# Patient Record
Sex: Female | Born: 1990
Health system: Southern US, Community
[De-identification: ages and names within clinical notes are randomized; demographics above are authoritative.]

## PROBLEM LIST (undated history)

## (undated) ENCOUNTER — Inpatient Hospital Stay (HOSPITAL_COMMUNITY): Payer: Self-pay

## (undated) DIAGNOSIS — Z789 Other specified health status: Secondary | ICD-10-CM

## (undated) DIAGNOSIS — IMO0002 Reserved for concepts with insufficient information to code with codable children: Secondary | ICD-10-CM

## (undated) DIAGNOSIS — N390 Urinary tract infection, site not specified: Secondary | ICD-10-CM

## (undated) HISTORY — PX: NO PAST SURGERIES: SHX2092

## (undated) HISTORY — DX: Urinary tract infection, site not specified: N39.0

---
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
## 2012-03-27 ENCOUNTER — Encounter: Payer: Self-pay | Admitting: Pediatrics

## 2012-03-27 ENCOUNTER — Other Ambulatory Visit: Payer: Self-pay | Admitting: Pediatrics

## 2012-03-27 ENCOUNTER — Ambulatory Visit (INDEPENDENT_AMBULATORY_CARE_PROVIDER_SITE_OTHER): Payer: BC Managed Care – PPO | Admitting: Pediatrics

## 2012-03-27 VITALS — BP 106/68 | Wt 176.1 lb

## 2012-03-27 DIAGNOSIS — N3090 Cystitis, unspecified without hematuria: Secondary | ICD-10-CM | POA: Insufficient documentation

## 2012-03-27 DIAGNOSIS — N390 Urinary tract infection, site not specified: Secondary | ICD-10-CM

## 2012-03-27 MED ORDER — SULFAMETHOXAZOLE-TRIMETHOPRIM 800-160 MG PO TABS: 1.0000 | ORAL_TABLET | Freq: Two times a day (BID) | ORAL | Status: AC

## 2012-03-27 NOTE — Progress Notes (Signed)
Subjective:    Patient ID: [name], female   DOB: 1991-03-04, 21 y.o.   MRN: [id number]  HPI: 4-5 day hx of dysur.ia. No fever. No nausea or vomiting. Mild Lower abd pain transiently last night, otherwise no pain. No abd pain today. Dysuria, frequency, urgency. No vaginal discharge.  Pertinent PMHx: Hx of recurrent lower UTI. Last UTI in Feb. Sexually active. Uses condoms and oral contraceptives.Has never had an STI.  Ran out of birth control pills while at the beach and missed several. Off for the past few weeks. Prescribed by a physician at [facility] Fragmented medical care the last few years. Student at [employer] but has never been to [employer]. Has seen Dr. Denny Levy at [facility]. Seeks care episodically for acute concerns.  Imm UTD NKDA Meds: Off Birth Control pills but has refill on Rx and intends to restart after next period. Other meds: OTC pyridium for UTI. Has not had a PE in over 2 years.    Objective:  Weight 176 lb 1.6 oz (79.878 kg). GEN: Alert, nontoxic, in NAD. Pleasant young lady.  Normal affect. COR: Quiet precordium, No murmur, RRR ABD: soft, nontender, nondistended, no organomegly, no masses SKIN: well perfused, no rashes NEURO: alert, active,oriented, grossly intact  No results found. No results found for this or any previous visit (from the past 240 hour(s)). @RESULTS @ Assessment:   Cystitis, recurrent Fragmented health care Delinquent health maintenance  Plan:  Urine culture sent U/A -- patient will bring Urine in the morning for U/A off pyridium Discussed importance of medical home. Suggested [employer] [facility] or Dr. Denny Levy at [facility] since she has seen Dr. Jennette Kettle at [facility]. Andrian will investigate these two options for the future After obtaining U/A will empirically start her on Septra DS for 5 days while awaiting culture results.

## 2012-03-27 NOTE — Patient Instructions (Signed)
Plenty of fluids Urinate frequently TMP - SMX per Rx Need to find primary care provider -- suggest [facility] or Dr. Denny Levy at [facility]

## 2012-03-28 LAB — URINALYSIS
Specific Gravity, Urine: 1.01 (ref 1.005–1.030)
pH: 7 (ref 5.0–8.0)

## 2012-03-30 LAB — URINE CULTURE: Colony Count: >=100000

## 2012-03-31 LAB — URINE CULTURE: Colony Count: >=100000

## 2012-05-02 ENCOUNTER — Other Ambulatory Visit: Payer: Self-pay | Admitting: Pediatrics

## 2012-05-02 ENCOUNTER — Encounter: Payer: Self-pay | Admitting: Pediatrics

## 2012-05-19 ENCOUNTER — Emergency Department (HOSPITAL_COMMUNITY)
Admission: EM | Admit: 2012-05-19 | Discharge: 2012-05-19 | Disposition: A | Discharge status: Home / Self Care | Payer: No Typology Code available for payment source | Attending: Emergency Medicine | Admitting: Emergency Medicine

## 2012-05-19 ENCOUNTER — Emergency Department (HOSPITAL_COMMUNITY): Payer: No Typology Code available for payment source

## 2012-05-19 ENCOUNTER — Encounter (HOSPITAL_COMMUNITY): Payer: Self-pay

## 2012-05-19 DIAGNOSIS — M545 Low back pain, unspecified: Secondary | ICD-10-CM | POA: Insufficient documentation

## 2012-05-19 DIAGNOSIS — M542 Cervicalgia: Secondary | ICD-10-CM | POA: Insufficient documentation

## 2012-05-19 HISTORY — DX: Reserved for concepts with insufficient information to code with codable children: IMO0002

## 2012-05-19 MED ORDER — CYCLOBENZAPRINE HCL 10 MG PO TABS: 10.0000 mg | ORAL_TABLET | Freq: Two times a day (BID) | ORAL | Status: AC | PRN

## 2012-05-19 MED ORDER — HYDROCODONE-ACETAMINOPHEN 5-500 MG PO TABS: 1.0000 | ORAL_TABLET | Freq: Four times a day (QID) | ORAL | Status: AC | PRN

## 2012-05-19 MED ORDER — MORPHINE SULFATE 4 MG/ML IJ SOLN
4.0000 mg | Freq: Once | INTRAMUSCULAR | Status: AC
  Administered 2012-05-19: 4 mg via INTRAVENOUS
  Filled 2012-05-19: qty 1

## 2012-05-19 MED ORDER — ONDANSETRON HCL 4 MG/2ML IJ SOLN
4.0000 mg | Freq: Once | INTRAMUSCULAR | Status: AC
  Administered 2012-05-19: 4 mg via INTRAVENOUS
  Filled 2012-05-19: qty 2

## 2012-05-19 NOTE — ED Notes (Signed)
Family at bedside. 
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
## 2012-05-20 NOTE — ED Provider Notes (Signed)
Medical screening examination/treatment/procedure(s) were performed by non-physician practitioner and as supervising physician I was immediately available for consultation/collaboration.   Dione Booze, MD 05/20/12 0140

## 2012-06-10 ENCOUNTER — Ambulatory Visit: Payer: No Typology Code available for payment source | Attending: Orthopedic Surgery | Admitting: Rehabilitation

## 2012-06-10 DIAGNOSIS — M25669 Stiffness of unspecified knee, not elsewhere classified: Secondary | ICD-10-CM | POA: Insufficient documentation

## 2012-06-10 DIAGNOSIS — M25569 Pain in unspecified knee: Secondary | ICD-10-CM | POA: Insufficient documentation

## 2012-06-10 DIAGNOSIS — IMO0001 Reserved for inherently not codable concepts without codable children: Secondary | ICD-10-CM | POA: Insufficient documentation

## 2012-06-16 ENCOUNTER — Ambulatory Visit: Payer: No Typology Code available for payment source | Admitting: Physical Therapy

## 2012-06-20 ENCOUNTER — Ambulatory Visit: Payer: No Typology Code available for payment source | Admitting: Physical Therapy

## 2012-06-23 ENCOUNTER — Ambulatory Visit: Payer: No Typology Code available for payment source | Admitting: Physical Therapy

## 2012-06-25 ENCOUNTER — Ambulatory Visit: Payer: No Typology Code available for payment source | Admitting: Physical Therapy

## 2012-06-27 ENCOUNTER — Ambulatory Visit: Payer: No Typology Code available for payment source | Admitting: Physical Therapy

## 2012-07-01 ENCOUNTER — Encounter: Payer: No Typology Code available for payment source | Admitting: Physical Therapy

## 2012-07-03 ENCOUNTER — Ambulatory Visit: Payer: No Typology Code available for payment source | Admitting: Physical Therapy

## 2012-07-08 ENCOUNTER — Ambulatory Visit: Payer: No Typology Code available for payment source | Admitting: Physical Therapy

## 2012-07-11 ENCOUNTER — Ambulatory Visit: Payer: No Typology Code available for payment source | Admitting: Physical Therapy

## 2012-07-15 ENCOUNTER — Ambulatory Visit: Payer: No Typology Code available for payment source | Admitting: Rehabilitation

## 2012-07-17 ENCOUNTER — Ambulatory Visit: Payer: No Typology Code available for payment source | Admitting: Physical Therapy

## 2012-07-18 ENCOUNTER — Ambulatory Visit: Payer: No Typology Code available for payment source | Attending: Orthopedic Surgery | Admitting: Physical Therapy

## 2012-07-18 DIAGNOSIS — M25669 Stiffness of unspecified knee, not elsewhere classified: Secondary | ICD-10-CM | POA: Insufficient documentation

## 2012-07-18 DIAGNOSIS — M25569 Pain in unspecified knee: Secondary | ICD-10-CM | POA: Insufficient documentation

## 2012-07-18 DIAGNOSIS — IMO0001 Reserved for inherently not codable concepts without codable children: Secondary | ICD-10-CM | POA: Insufficient documentation

## 2012-07-25 ENCOUNTER — Ambulatory Visit: Payer: No Typology Code available for payment source | Admitting: Physical Therapy

## 2012-07-31 ENCOUNTER — Ambulatory Visit: Payer: No Typology Code available for payment source | Admitting: Physical Therapy

## 2012-08-05 ENCOUNTER — Ambulatory Visit: Payer: No Typology Code available for payment source | Admitting: Rehabilitation

## 2012-08-08 ENCOUNTER — Ambulatory Visit: Payer: No Typology Code available for payment source | Admitting: Physical Therapy

## 2013-09-03 ENCOUNTER — Telehealth: Payer: Self-pay

## 2013-09-03 ENCOUNTER — Ambulatory Visit (INDEPENDENT_AMBULATORY_CARE_PROVIDER_SITE_OTHER): Payer: BC Managed Care – PPO | Admitting: Family Medicine

## 2013-09-03 VITALS — BP 120/72 | HR 78 | Temp 97.9°F | Resp 18 | Ht 68.0 in | Wt 166.0 lb

## 2013-09-03 DIAGNOSIS — R112 Nausea with vomiting, unspecified: Secondary | ICD-10-CM

## 2013-09-03 DIAGNOSIS — E860 Dehydration: Secondary | ICD-10-CM

## 2013-09-03 DIAGNOSIS — R109 Unspecified abdominal pain: Secondary | ICD-10-CM

## 2013-09-03 DIAGNOSIS — K2920 Alcoholic gastritis without bleeding: Secondary | ICD-10-CM

## 2013-09-03 LAB — POCT CBC
Hemoglobin: 14.5 g/dL (ref 12.2–16.2)
MCH, POC: 31.2 pg (ref 27–31.2)
MID (cbc): 0.4 (ref 0–0.9)
MPV: 8.8 fL (ref 0–99.8)
Platelet Count, POC: 353 10*3/uL (ref 142–424)
RBC: 4.65 M/uL (ref 4.04–5.48)

## 2013-09-03 LAB — POCT UA - MICROSCOPIC ONLY
Crystals, Ur, HPF, POC: NEGATIVE
Mucus, UA: POSITIVE
Yeast, UA: NEGATIVE

## 2013-09-03 LAB — POCT URINALYSIS DIPSTICK
Glucose, UA: NEGATIVE
Leukocytes, UA: NEGATIVE
Protein, UA: 100

## 2013-09-03 LAB — COMPREHENSIVE METABOLIC PANEL
ALT: 15 U/L (ref 0–35)
AST: 25 U/L (ref 0–37)
Chloride: 101 mEq/L (ref 96–112)
Potassium: 4.4 mEq/L (ref 3.5–5.3)
Sodium: 138 mEq/L (ref 135–145)

## 2013-09-03 MED ORDER — ONDANSETRON 8 MG PO TBDP: 8.0000 mg | ORAL_TABLET | Freq: Three times a day (TID) | ORAL | Status: DC | PRN

## 2013-09-03 NOTE — Telephone Encounter (Signed)
[facility] pharmacist called looking for Rx for pt. We had sent it to [facility] [address] instead of [facility]. I called in Rx info to pharmacist while on phone and asked him to check if quantity would go through ins since we often have PA required. Pharmacist stated PA required for #30, but will pay for #21. I decreased Quantity to #21 and called [facility] to cancel.

## 2013-09-03 NOTE — Patient Instructions (Signed)
Alcoholic Gastritis You have alcoholic gastritis. This is an inflammation of the lining of the stomach. It is caused by drinking alcohol. The symptoms may include: burning abdominal pain, nausea, vomiting or even vomiting blood. It can be made worse by a poor diet. People who drink frequently often do not eat well. Taking aspirin or other anti-inflammatory medications increases stomach irritation and bleeding. These medicines should be avoided. Treatment is aimed at the cause. You have to stop drinking alcohol if you want to get better. Eat a healthy, well-balanced diet. You may take liquid antacids as needed. Your caregiver may prescribe medications to help heal the stomach lining. Take these as prescribed. PREVENTION   Anyone who has experienced alcoholic gastritis should consider that alcoholism may be an issue. Professional evaluation is highly recommended.  Although some people can recover without help, most need assistance. With treatment and support, many are able to stop drinking and rebuild their lives. Long-term recovery is possible.  Alcohol Addiction cannot be cured, but it can be treated successfully. Treatment centers are listed in telephone listings under:  Alcoholism and Addiction Treatment; Substance Abuse Treatment or Cocaine, Narcotics and Alcoholics Anonymous. Most hospitals and clinics can refer you to a specialized care center.  The [place] government maintains a toll-free number for treatment referrals: 1-800-662-4357 or 1-800-487-4889 (TDD). They also maintain a website: http://findtreatment.samhsa.gov. Other websites for more information are: www.mentalhealth.samhsa.gov and www.nida.gov.  In [place], treatment resources are listed in each province. Listings are available under:  [employer] or similar titles. SEEK IMMEDIATE MEDICAL CARE IF:   You develop severe abdominal pain, uncontrolled vomiting, or vomiting blood.  You blackout or have fainting  spells.  You develop seizures (this could be life threatening).  You develop bloody stools or stools that appear black or tarry. Document Released: 01/03/2005 Document Revised: 02/18/2012 Document Reviewed: 11/30/2009 ExitCare Patient Information 2014 [employer]. Gastritis, Adult Gastritis is soreness and swelling (inflammation) of the lining of the stomach. Gastritis can develop as a sudden onset (acute) or long-term (chronic) condition. If gastritis is not treated, it can lead to stomach bleeding and ulcers. CAUSES  Gastritis occurs when the stomach lining is weak or damaged. Digestive juices from the stomach then inflame the weakened stomach lining. The stomach lining may be weak or damaged due to viral or bacterial infections. One common bacterial infection is the Helicobacter pylori infection. Gastritis can also result from excessive alcohol consumption, taking certain medicines, or having too much acid in the stomach.  SYMPTOMS  In some cases, there are no symptoms. When symptoms are present, they may include:  Pain or a burning sensation in the upper abdomen.  Nausea.  Vomiting.  An uncomfortable feeling of fullness after eating. DIAGNOSIS  Your caregiver may suspect you have gastritis based on your symptoms and a physical exam. To determine the cause of your gastritis, your caregiver may perform the following:  Blood or stool tests to check for the H pylori bacterium.  Gastroscopy. A thin, flexible tube (endoscope) is passed down the esophagus and into the stomach. The endoscope has a light and camera on the end. Your caregiver uses the endoscope to view the inside of the stomach.  Taking a tissue sample (biopsy) from the stomach to examine under a microscope. TREATMENT  Depending on the cause of your gastritis, medicines may be prescribed. If you have a bacterial infection, such as an H pylori infection, antibiotics may be given. If your gastritis is caused by too much    acid in the stomach, H2 blockers or antacids may be given. Your caregiver may recommend that you stop taking aspirin, ibuprofen, or other nonsteroidal anti-inflammatory drugs (NSAIDs). HOME CARE INSTRUCTIONS  Only take over-the-counter or prescription medicines as directed by your caregiver.  If you were given antibiotic medicines, take them as directed. Finish them even if you start to feel better.  Drink enough fluids to keep your urine clear or pale yellow.  Avoid foods and drinks that make your symptoms worse, such as:  Caffeine or alcoholic drinks.  Chocolate.  Peppermint or mint flavorings.  Garlic and onions.  Spicy foods.  Citrus fruits, such as oranges, lemons, or limes.  Tomato-based foods such as sauce, chili, salsa, and pizza.  Fried and fatty foods.  Eat small, frequent meals instead of large meals. SEEK IMMEDIATE MEDICAL CARE IF:   You have black or dark red stools.  You vomit blood or material that looks like coffee grounds.  You are unable to keep fluids down.  Your abdominal pain gets worse.  You have a fever.  You do not feel better after 1 week.  You have any other questions or concerns. MAKE SURE YOU:  Understand these instructions.  Will watch your condition.  Will get help right away if you are not doing well or get worse. Document Released: 11/20/2001 Document Revised: 05/27/2012 Document Reviewed: 01/09/2012 ExitCare Patient Information 2014 [employer].  

## 2013-09-03 NOTE — Progress Notes (Signed)
Subjective:    Patient ID: [name], female    DOB: 04-21-1991, 22 y.o.   MRN: [id number] Chief Complaint  Patient presents with  . vomiting, headache, stomach pain    stated this morning    HPI  Went out last night and was drinking alcohol.  This time is much worse than anytime previous. Has been ill since 2 a.m. And then finally slept till 7:30 - then tried to go into class but kept on vomiting - had to pull over car and leave class several times.  With the last several vomits it looked like there was blood in there. Had 4-5 shots/mixed drinks - no beer or wine.  Tried to drink some water but just throws it right back up.  Blood in vomiting looked more ilke coffee grounds.  Has been urinating normaly. Had a nml BM this a.m.  Past Medical History  Diagnosis Date  . UTI (lower urinary tract infection)     recurrent  . Herniated disc    History reviewed. No pertinent past surgical history. History reviewed. No pertinent family history.   Review of Systems  Constitutional: Positive for diaphoresis, activity change, appetite change and fatigue. Negative for fever, chills and unexpected weight change.  Respiratory: Negative for shortness of breath.   Cardiovascular: Negative for chest pain and leg swelling.  Gastrointestinal: Positive for nausea, vomiting, abdominal pain and abdominal distention. Negative for diarrhea, constipation, blood in stool, anal bleeding and rectal pain.  Genitourinary: Negative for dysuria, decreased urine volume and difficulty urinating.  Musculoskeletal: Negative for gait problem.  Skin: Negative for rash.  Neurological: Positive for weakness, light-headedness and headaches. Negative for dizziness, tremors, seizures, syncope, facial asymmetry, speech difficulty and numbness.  Hematological: Negative for adenopathy.  Psychiatric/Behavioral: Positive for sleep disturbance.       BP 120/72  Pulse 78  Temp(Src) 97.9 F (36.6 C) (Oral)  Resp 18  Ht  5\' 8"  (1.727 m)  Wt 166 lb (75.297 kg)  BMI 25.25 kg/m2  SpO2 97%  LMP 08/24/2013 Objective:   Physical Exam  Constitutional: She is oriented to person, place, and time. She appears well-developed and well-nourished. No distress.  HENT:  Head: Normocephalic and atraumatic.  Neck: Normal range of motion. Neck supple. No thyromegaly present.  Cardiovascular: Normal rate, regular rhythm, normal heart sounds and intact distal pulses.   Pulmonary/Chest: Effort normal and breath sounds normal. No respiratory distress.  Abdominal: Soft. Normal appearance and bowel sounds are normal. She exhibits no distension and no mass. There is no hepatosplenomegaly. There is generalized tenderness. There is CVA tenderness. There is no rigidity, no rebound, no guarding, no tenderness at McBurney's point and negative Murphy's sign. No hernia.  Musculoskeletal: She exhibits no edema.  Lymphadenopathy:    She has no cervical adenopathy.  Neurological: She is alert and oriented to person, place, and time.  Skin: Skin is warm and dry. She is not diaphoretic. No erythema.  Psychiatric: She has a normal mood and affect. Her behavior is normal.          Assessment & Plan:   Nausea with vomiting - Plan: POCT CBC, POCT UA - Microscopic Only, POCT urinalysis dipstick, Lipase, Comprehensive metabolic panel, CANCELED: Comprehensive metabolic panel, CANCELED: Lipase  Abdominal  pain, other specified site - Plan: POCT CBC, POCT UA - Microscopic Only, POCT urinalysis dipstick, Lipase, Comprehensive metabolic panel, CANCELED: Comprehensive metabolic panel, CANCELED: Lipase  Alcoholic gastritis  Meds ordered this encounter  Medications  .  ondansetron (ZOFRAN-ODT)  8 MG disintegrating tablet    Sig: Take 1 tablet (8 mg total) by mouth every 8 (eight) hours as needed for nausea.    Dispense:  30 tablet    Refill:  0    Norberto Sorenson, MD MPH

## 2014-07-17 ENCOUNTER — Encounter (HOSPITAL_COMMUNITY): Payer: Self-pay | Admitting: Emergency Medicine

## 2014-07-17 ENCOUNTER — Emergency Department (HOSPITAL_COMMUNITY)
Admission: EM | Admit: 2014-07-17 | Discharge: 2014-07-17 | Disposition: A | Discharge status: Home / Self Care | Payer: BC Managed Care – PPO | Attending: Emergency Medicine | Admitting: Emergency Medicine

## 2014-07-17 DIAGNOSIS — K226 Gastro-esophageal laceration-hemorrhage syndrome: Secondary | ICD-10-CM | POA: Insufficient documentation

## 2014-07-17 DIAGNOSIS — Z87440 Personal history of urinary (tract) infections: Secondary | ICD-10-CM | POA: Insufficient documentation

## 2014-07-17 DIAGNOSIS — K920 Hematemesis: Secondary | ICD-10-CM | POA: Insufficient documentation

## 2014-07-17 DIAGNOSIS — Z8739 Personal history of other diseases of the musculoskeletal system and connective tissue: Secondary | ICD-10-CM | POA: Insufficient documentation

## 2014-07-17 LAB — COMPREHENSIVE METABOLIC PANEL
ALT: 15 U/L (ref 0–35)
AST: 25 U/L (ref 0–37)
Albumin: 4.7 g/dL (ref 3.5–5.2)
Alkaline Phosphatase: 61 U/L (ref 39–117)
Anion gap: 18 — ABNORMAL HIGH (ref 5–15)
BILIRUBIN TOTAL: 0.9 mg/dL (ref 0.3–1.2)
BUN: 8 mg/dL (ref 6–23)
CHLORIDE: 100 meq/L (ref 96–112)
CO2: 26 mEq/L (ref 19–32)
Calcium: 9.4 mg/dL (ref 8.4–10.5)
Creatinine, Ser: 0.83 mg/dL (ref 0.50–1.10)
GFR calc Af Amer: >90 mL/min (ref >90)
GFR calc non Af Amer: >90 mL/min (ref >90)
GLUCOSE: 116 mg/dL — AB (ref 70–99)
POTASSIUM: 3.9 meq/L (ref 3.7–5.3)
SODIUM: 144 meq/L (ref 137–147)
TOTAL PROTEIN: 8.4 g/dL — AB (ref 6.0–8.3)

## 2014-07-17 LAB — CBC WITH DIFFERENTIAL/PLATELET
Basophils Absolute: 0 10*3/uL (ref 0.0–0.1)
Basophils Relative: 0 % (ref 0–1)
Eosinophils Absolute: 0 10*3/uL (ref 0.0–0.7)
Eosinophils Relative: 0 % (ref 0–5)
HCT: 47.4 % — ABNORMAL HIGH (ref 36.0–46.0)
Hemoglobin: 15.8 g/dL — ABNORMAL HIGH (ref 12.0–15.0)
LYMPHS ABS: 1.3 10*3/uL (ref 0.7–4.0)
LYMPHS PCT: 11 % — AB (ref 12–46)
MCH: 31.5 pg (ref 26.0–34.0)
MCHC: 33.3 g/dL (ref 30.0–36.0)
MCV: 94.4 fL (ref 78.0–100.0)
MONO ABS: 0.4 10*3/uL (ref 0.1–1.0)
Monocytes Relative: 4 % (ref 3–12)
Neutro Abs: 9.8 10*3/uL — ABNORMAL HIGH (ref 1.7–7.7)
Neutrophils Relative %: 85 % — ABNORMAL HIGH (ref 43–77)
PLATELETS: 308 10*3/uL (ref 150–400)
RBC: 5.02 MIL/uL (ref 3.87–5.11)
RDW: 13.6 % (ref 11.5–15.5)
WBC: 11.5 10*3/uL — ABNORMAL HIGH (ref 4.0–10.5)

## 2014-07-17 LAB — LIPASE, BLOOD: Lipase: 14 U/L (ref 11–59)

## 2014-07-17 MED ORDER — PANTOPRAZOLE SODIUM 40 MG PO TBEC: 40.0000 mg | DELAYED_RELEASE_TABLET | Freq: Two times a day (BID) | ORAL | Status: DC

## 2014-07-17 MED ORDER — ONDANSETRON HCL 4 MG/2ML IJ SOLN
4.0000 mg | Freq: Once | INTRAMUSCULAR | Status: AC
  Administered 2014-07-17: 4 mg via INTRAVENOUS
  Filled 2014-07-17: qty 2

## 2014-07-17 MED ORDER — SODIUM CHLORIDE 0.9 % IV BOLUS (SEPSIS)
1000.0000 mL | Freq: Once | INTRAVENOUS | Status: AC
  Administered 2014-07-17: 1000 mL via INTRAVENOUS

## 2014-07-17 MED ORDER — ONDANSETRON HCL 4 MG PO TABS: 4.0000 mg | ORAL_TABLET | Freq: Four times a day (QID) | ORAL | Status: DC

## 2014-07-17 NOTE — ED Provider Notes (Signed)
CSN: [id number]     Arrival date & time 07/17/14  1145 History   First MD Initiated Contact with Patient 07/17/14 1202     Chief Complaint  Patient presents with  . Hematemesis     (Consider location/radiation/quality/duration/timing/severity/associated sxs/prior Treatment) Patient is a 23 y.o. female presenting with vomiting.  Emesis Severity:  Moderate Duration:  1 day Timing:  Constant Number of daily episodes:  7 Quality:  Stomach contents and bright red blood Progression:  Unchanged Chronicity:  New Recent urination:  Normal Relieved by:  Nothing Worsened by:  Nothing tried Associated symptoms: abdominal pain   Associated symptoms: no chills, no cough, no diarrhea, no fever, no sore throat and no URI   Risk factors: alcohol use (last night)     Past Medical History  Diagnosis Date  . UTI (lower urinary tract infection)     recurrent  . Herniated disc    History reviewed. No pertinent past surgical history. No family history on file. History  Substance Use Topics  . Smoking status: Never Smoker   . Smokeless tobacco: Not on file  . Alcohol Use: Yes   OB History   Grav Para Term Preterm Abortions TAB SAB Ect Mult Living                 Review of Systems  Constitutional: Negative for fever and chills.  HENT: Negative for congestion, rhinorrhea and sore throat.   Eyes: Negative for photophobia and visual disturbance.  Respiratory: Negative for cough and shortness of breath.   Cardiovascular: Negative for chest pain and leg swelling.  Gastrointestinal: Positive for vomiting and abdominal pain. Negative for nausea, diarrhea and constipation.  Endocrine: Negative for polyphagia and polyuria.  Genitourinary: Negative for dysuria, flank pain, vaginal bleeding, vaginal discharge and enuresis.  Musculoskeletal: Negative for back pain and gait problem.  Skin: Negative for color change and rash.  Neurological: Negative for dizziness, syncope, light-headedness and  numbness.  Hematological: Negative for adenopathy. Does not bruise/bleed easily.  All other systems reviewed and are negative.     Allergies  Review of patient's allergies indicates no known allergies.  Home Medications   Prior to Admission medications   Medication Sig Start Date End Date Taking? Authorizing Provider  ondansetron (ZOFRAN) 4 MG tablet Take 1 tablet (4 mg total) by mouth every 6 (six) hours. 07/17/14   Mirian Mo, MD  pantoprazole (PROTONIX) 40 MG tablet Take 1 tablet (40 mg total) by mouth 2 (two) times daily. 07/17/14   Mirian Mo, MD   BP 132/81  Pulse 60  Temp(Src) 97.5 F (36.4 C) (Oral)  Resp 18  Ht 5\' 8"  (1.727 m)  Wt 152 lb 9 oz (69.202 kg)  BMI 23.20 kg/m2  SpO2 100%  LMP 07/05/2014 Physical Exam  Vitals reviewed. Constitutional: She is oriented to person, place, and time. She appears well-developed and well-nourished.  HENT:  Head: Normocephalic and atraumatic.  Right Ear: External ear normal.  Left Ear: External ear normal.  Eyes: Conjunctivae and EOM are normal. Pupils are equal, round, and reactive to light.  Neck: Normal range of motion. Neck supple.  Cardiovascular: Normal rate, regular rhythm, normal heart sounds and intact distal pulses.   Pulmonary/Chest: Effort normal and breath sounds normal.  Abdominal: Soft. Bowel sounds are normal. There is tenderness in the right upper quadrant and epigastric area.  Musculoskeletal: Normal range of motion.  Neurological: She is alert and oriented to person, place, and time.  Skin: Skin is warm and dry.  ED Course  Procedures (including critical care time) Labs Review Labs Reviewed  CBC WITH DIFFERENTIAL - Abnormal; Notable for the following:    WBC 11.5 (*)    Hemoglobin 15.8 (*)    HCT 47.4 (*)    Neutrophils Relative % 85 (*)    Neutro Abs 9.8 (*)    Lymphocytes Relative 11 (*)    All other components within normal limits  COMPREHENSIVE METABOLIC PANEL - Abnormal; Notable for the  following:    Glucose, Bld 116 (*)    Total Protein 8.4 (*)    Anion gap 18 (*)    All other components within normal limits  LIPASE, BLOOD  URINALYSIS, ROUTINE W REFLEX MICROSCOPIC  POC URINE PREG, ED    Imaging Review No results found.   EKG Interpretation None      MDM   Final diagnoses:  Mallory-Weiss tear    23 y.o. female  without pertinent PMH presents with chief complaint of hematemesis. Patient was in her normal state of health yesterday, however last night drink a significant amount of vodka. This morning she woke up and had vomiting, vomited 6-7 times with stomach contents, followed by 2 vomiting episodes in increasing amounts of blood which the patient describes as dark red blood with some spots. She has no history of peptic ulcer disease, or other GI issues. Physical exam as above with epigastric and right upper quadrant tenderness. Will check labs to ensure the patient is not currently having a pancreatic flare, however she has no history of pancreatitis or gallstones.    Labs returned as above with stable hemoglobin, and patient had a vomiting episode which was clear in the department.  She was given Zofran, a normal saline bolus and had improvement of her symptoms.  Labs as above were unremarkable, suspect that the patient had alcohol mediated vomiting this morning and Mallory-Weiss tear, however discussed return precautions for possible peptic ulcer disease and informed the patient to take a PPI.    Labs and imaging as above reviewed.   1. Mallory-Weiss tear         Mirian MoMatthew Ilene Witcher, MD 07/17/14 1416

## 2014-07-17 NOTE — ED Notes (Signed)
Patient unable to urinate for sample

## 2014-07-17 NOTE — ED Notes (Signed)
Pt c/o vomiting blood onset this morning. Pt reports drinking last night but does not know how much. Pt with dry heaves in triage.

## 2014-07-17 NOTE — Discharge Instructions (Signed)

## 2014-12-10 NOTE — L&D Delivery Note (Signed)
Delivery Note At 11:37 AM a viable female was delivered via  (Presentation: ;  ).  APGAR: , ; weight  .   Placenta status: , .  Cord:  with the following complications: .  Cord pH: not done  Anesthesia: Epidural  Episiotomy: None Lacerations: 1st degree;Sulcus;Periurethral Suture Repair: 2.0 vicryl Est. Blood Loss (mL):    Mom to postpartum.  Baby to Couplet care / Skin to Skin.  MARSHALL,BERNARD A 09/01/2015, 11:50 AM

## 2014-12-29 ENCOUNTER — Inpatient Hospital Stay (HOSPITAL_COMMUNITY)
Admission: AD | Admit: 2014-12-29 | Discharge: 2014-12-29 | Disposition: A | Discharge status: Home / Self Care | Payer: BC Managed Care – PPO | Source: Ambulatory Visit | Attending: Obstetrics & Gynecology | Admitting: Obstetrics & Gynecology

## 2014-12-29 ENCOUNTER — Encounter (HOSPITAL_COMMUNITY): Payer: Self-pay

## 2014-12-29 DIAGNOSIS — Z3200 Encounter for pregnancy test, result unknown: Secondary | ICD-10-CM | POA: Diagnosis present

## 2014-12-29 DIAGNOSIS — Z3201 Encounter for pregnancy test, result positive: Secondary | ICD-10-CM | POA: Insufficient documentation

## 2014-12-29 LAB — URINALYSIS, ROUTINE W REFLEX MICROSCOPIC
BILIRUBIN URINE: NEGATIVE
GLUCOSE, UA: NEGATIVE mg/dL
Hgb urine dipstick: NEGATIVE
KETONES UR: NEGATIVE mg/dL
NITRITE: NEGATIVE
PH: 7.5 (ref 5.0–8.0)
PROTEIN: NEGATIVE mg/dL
Specific Gravity, Urine: 1.01 (ref 1.005–1.030)
Urobilinogen, UA: 1 mg/dL (ref 0.0–1.0)

## 2014-12-29 LAB — URINE MICROSCOPIC-ADD ON

## 2014-12-29 LAB — POCT PREGNANCY, URINE: PREG TEST UR: POSITIVE — AB

## 2014-12-29 NOTE — MAU Note (Signed)
Late for period, usually comes on the 15th.  Had dizzy episode yesterday.  Has not done HPT.  Denies bleeding, does have some abd & back pain.

## 2014-12-29 NOTE — Discharge Instructions (Signed)
Prenatal Care  °WHAT IS PRENATAL CARE?  °Prenatal care means health care during your pregnancy, before your baby is born. It is very important to take care of yourself and your baby during your pregnancy by:  °· Getting early prenatal care. If you know you are pregnant, or think you might be pregnant, call your health care provider as soon as possible. Schedule a visit for a prenatal exam. °· Getting regular prenatal care. Follow your health care provider's schedule for blood and other necessary tests. Do not miss appointments. °· Doing everything you can to keep yourself and your baby healthy during your pregnancy. °· Getting complete care. Prenatal care should include evaluation of the medical, dietary, educational, psychological, and social needs of you and your significant other. The medical and genetic history of your family and the family of your baby's father should be discussed with your health care provider. °· Discussing with your health care provider: °· Prescription, over-the-counter, and herbal medicines that you take. °· Any history of substance abuse, alcohol use, smoking, and illegal drug use. °· Any history of domestic abuse and violence. °· Immunizations you have received. °· Your nutrition and diet. °· The amount of exercise you do. °· Any environmental and occupational hazards to which you are exposed. °· History of sexually transmitted infections for both you and your partner. °· Previous pregnancies you have had. °WHY IS PRENATAL CARE SO IMPORTANT?  °By regularly seeing your health care provider, you help ensure that problems can be identified early so that they can be treated as soon as possible. Other problems might be prevented. Many studies have shown that early and regular prenatal care is important for the health of mothers and their babies.  °HOW CAN I TAKE CARE OF MYSELF WHILE I AM PREGNANT?  °Here are ways to take care of yourself and your baby:  °· Start or continue taking your  multivitamin with 400 micrograms (mcg) of folic acid every day. °· Get early and regular prenatal care. It is very important to see a health care provider during your pregnancy. Your health care provider will check at each visit to make sure that you and your baby are healthy. If there are any problems, action can be taken right away to help you and your baby. °· Eat a healthy diet that includes: °¨ Fruits. °¨ Vegetables. °¨ Foods low in saturated fat. °¨ Whole grains. °¨ Calcium-rich foods, such as milk, yogurt, and hard cheeses. °· Drink 6-8 glasses of liquids a day. °· Unless your health care provider tells you not to, try to be physically active for 30 minutes, most days of the week. If you are pressed for time, you can get your activity in through 10-minute segments, three times a day. °· Do not smoke, drink alcohol, or use drugs. These can cause long-term damage to your baby. Talk with your health care provider about steps to take to stop smoking. Talk with a member of your faith community, a counselor, a trusted friend, or your health care provider if you are concerned about your alcohol or drug use. °· Ask your health care provider before taking any medicine, even over-the-counter medicines. Some medicines are not safe to take during pregnancy. °· Get plenty of rest and sleep. °· Avoid hot tubs and saunas during pregnancy. °· Do not have X-rays taken unless absolutely necessary and with the recommendation of your health care provider. A lead shield can be placed on your abdomen to protect your baby when   X-rays are taken in other parts of your body. °· Do not empty the cat litter when you are pregnant. It may contain a parasite that causes an infection called toxoplasmosis, which can cause birth defects. Also, use gloves when working in garden areas used by cats. °· Do not eat uncooked or undercooked meats or fish. °· Do not eat soft, mold-ripened cheeses (Brie, Camembert, and chevre) or soft, blue-veined  cheese (Danish blue and Roquefort). °· Stay away from toxic chemicals like: °¨ Insecticides. °¨ Solvents (some cleaners or paint thinners). °¨ Lead. °¨ Mercury. °· Sexual intercourse may continue until the end of the pregnancy, unless you have a medical problem or there is a problem with the pregnancy and your health care provider tells you not to. °· Do not wear high-heel shoes, especially during the second half of the pregnancy. You can lose your balance and fall. °· Do not take long trips, unless absolutely necessary. Be sure to see your health care provider before going on the trip. °· Do not sit in one position for more than 2 hours when on a trip. °· Take a copy of your medical records when going on a trip. Know where a hospital is located in the city you are visiting, in case of an emergency. °· Most dangerous household products will have pregnancy warnings on their labels. Ask your health care provider about products if you are unsure. °· Limit or eliminate your caffeine intake from coffee, tea, sodas, medicines, and chocolate. °· Many women continue working through pregnancy. Staying active might help you stay healthier. If you have a question about the safety or the hours you work at your particular job, talk with your health care provider. °· Get informed: °¨ Read books. °¨ Watch videos. °¨ Go to childbirth classes for you and your significant other. °¨ Talk with experienced moms. °· Ask your health care provider about childbirth education classes for you and your partner. Classes can help you and your partner prepare for the birth of your baby. °· Ask about a baby doctor (pediatrician) and methods and pain medicine for labor, delivery, and possible cesarean delivery. °HOW OFTEN SHOULD I SEE MY HEALTH CARE PROVIDER DURING PREGNANCY?  °Your health care provider will give you a schedule for your prenatal visits. You will have visits more often as you get closer to the end of your pregnancy. An average  pregnancy lasts about 40 weeks.  °A typical schedule includes visiting your health care provider:  °· About once each month during your first 6 months of pregnancy. °· Every 2 weeks during the next 2 months. °· Weekly in the last month, until the delivery date. °Your health care provider will probably want to see you more often if: °· You are older than 35 years. °· Your pregnancy is high risk because you have certain health problems or problems with the pregnancy, such as: °· Diabetes. °· High blood pressure. °· The baby is not growing on schedule, according to the dates of the pregnancy. °Your health care provider will do special tests to make sure you and your baby are not having any serious problems. °WHAT HAPPENS DURING PRENATAL VISITS?  °· At your first prenatal visit, your health care provider will do a physical exam and talk to you about your health history and the health history of your partner and your family. Your health care provider will be able to tell you what date to expect your baby to be born on. °· Your   first physical exam will include checks of your blood pressure, measurements of your height and weight, and an exam of your pelvic organs. Your health care provider will do a Pap test if you have not had one recently and will do cultures of your cervix to make sure there is no infection. °· At each prenatal visit, there will be tests of your blood, urine, blood pressure, weight, and the progress of the baby will be checked. °· At your later prenatal visits, your health care provider will check how you are doing and how your baby is developing. You may have a number of tests done as your pregnancy progresses. °· Ultrasound exams are often used to check on your baby's growth and health. °· You may have more urine and blood tests, as well as special tests, if needed. These may include amniocentesis to examine fluid in the pregnancy sac, stress tests to check how the baby responds to contractions, or a  biophysical profile to measure your baby's well-being. Your health care provider will explain the tests and why they are necessary. °· You should be tested for high blood sugar (gestational diabetes) between the 24th and 28th weeks of your pregnancy. °· You should discuss with your health care provider your plans to breastfeed or bottle-feed your baby. °· Each visit is also a chance for you to learn about staying healthy during pregnancy and to ask questions. °Document Released: 11/29/2003 Document Revised: 12/01/2013 Document Reviewed: 02/10/2014 °[employer]® Patient Information ©2015 [employer]. This information is not intended to replace advice given to you by your health care provider. Make sure you discuss any questions you have with your health care provider. °First Trimester of Pregnancy °The first trimester of pregnancy is from week 1 until the end of week 12 (months 1 through 3). During this time, your baby will begin to develop inside you. At 6-8 weeks, the eyes and face are formed, and the heartbeat can be seen on ultrasound. At the end of 12 weeks, all the baby's organs are formed. Prenatal care is all the medical care you receive before the birth of your baby. Make sure you get good prenatal care and follow all of your doctor's instructions. °HOME CARE  °Medicines °· Take medicine only as told by your doctor. Some medicines are safe and some are not during pregnancy. °· Take your prenatal vitamins as told by your doctor. °· Take medicine that helps you poop (stool softener) as needed if your doctor says it is okay. °Diet °· Eat regular, healthy meals. °· Your doctor will tell you the amount of weight gain that is right for you. °· Avoid raw meat and uncooked cheese. °· If you feel sick to your stomach (nauseous) or throw up (vomit): °¨ Eat 4 or 5 small meals a day instead of 3 large meals. °¨ Try eating a few soda crackers. °¨ Drink liquids between meals instead of during meals. °· If you have a hard  time pooping (constipation): °¨ Eat high-fiber foods like fresh vegetables, fruit, and whole grains. °¨ Drink enough fluids to keep your pee (urine) clear or pale yellow. °Activity and Exercise °· Exercise only as told by your doctor. Stop exercising if you have cramps or pain in your lower belly (abdomen) or low back. °· Try to avoid standing for long periods of time. Move your legs often if you must stand in one place for a long time. °· Avoid heavy lifting. °· Wear low-heeled shoes. Sit and stand up   straight. °· You can have sex unless your doctor tells you not to. °Relief of Pain or Discomfort °· Wear a good support bra if your breasts are sore. °· Take warm water baths (sitz baths) to soothe pain or discomfort caused by hemorrhoids. Use hemorrhoid cream if your doctor says it is okay. °· Rest with your legs raised if you have leg cramps or low back pain. °· Wear support hose if you have puffy, bulging veins (varicose veins) in your legs. Raise (elevate) your feet for 15 minutes, 3-4 times a day. Limit salt in your diet. °Prenatal Care °· Schedule your prenatal visits by the twelfth week of pregnancy. °· Write down your questions. Take them to your prenatal visits. °· Keep all your prenatal visits as told by your doctor. °Safety °· Wear your seat belt at all times when driving. °· Make a list of emergency phone numbers. The list should include numbers for family, friends, the hospital, and police and fire departments. °General Tips °· Ask your doctor for a referral to a local prenatal class. Begin classes no later than at the start of month 6 of your pregnancy. °· Ask for help if you need counseling or help with nutrition. Your doctor can give you advice or tell you where to go for help. °· Do not use hot tubs, steam rooms, or saunas. °· Do not douche or use tampons or scented sanitary pads. °· Do not cross your legs for long periods of time. °· Avoid litter boxes and soil used by cats. °· Avoid all smoking,  herbs, and alcohol. Avoid drugs not approved by your doctor. °· Visit your dentist. At home, brush your teeth with a soft toothbrush. Be gentle when you floss. °GET HELP IF: °· You are dizzy. °· You have mild cramps or pressure in your lower belly. °· You have a nagging pain in your belly area. °· You continue to feel sick to your stomach, throw up, or have watery poop (diarrhea). °· You have a bad smelling fluid coming from your vagina. °· You have pain with peeing (urination). °· You have increased puffiness (swelling) in your face, hands, legs, or ankles. °GET HELP RIGHT AWAY IF:  °· You have a fever. °· You are leaking fluid from your vagina. °· You have spotting or bleeding from your vagina. °· You have very bad belly cramping or pain. °· You gain or lose weight rapidly. °· You throw up blood. It may look like coffee grounds. °· You are around people who have German measles, fifth disease, or chickenpox. °· You have a very bad headache. °· You have shortness of breath. °· You have any kind of trauma, such as from a fall or a car accident. °Document Released: 05/14/2008 Document Revised: 04/12/2014 Document Reviewed: 10/06/2013 °Exit[employer]® Patient Information ©2015 [employer]. This information is not intended to replace advice given to you by your health care provider. Make sure you discuss any questions you have with your health care provider. ° °

## 2014-12-29 NOTE — MAU Provider Note (Signed)
Ms. [name] is a 24 y.o. G1P0 at 933w1d who presents to MAU today for confirmation of pregnancy. She states LMP 11/23/14. She has not taken HPT. She denies abdominal pain or vaginal bleeding.   BP 139/69 mmHg  Pulse 72  Temp(Src) 98.2 F (36.8 C) (Oral)  Resp 16  Ht 5\' 8"  (1.727 m)  Wt 169 lb 12.8 oz (77.021 kg)  BMI 25.82 kg/m2  LMP 11/23/2014 (Exact Date) GENERAL: Well-developed, well-nourished female in no acute distress.  HEENT: Normocephalic, atraumatic.   LUNGS: Effort normal HEART: Regular rate  SKIN: Warm, dry and without erythema PSYCH: Normal mood and affect  Results for orders placed or performed during the hospital encounter of 12/29/14 (from the past 24 hour(s))  Urinalysis, Routine w reflex microscopic     Status: Abnormal   Collection Time: 12/29/14  6:45 PM  Result Value Ref Range   Color, Urine YELLOW YELLOW   APPearance CLEAR CLEAR   Specific Gravity, Urine 1.010 1.005 - 1.030   pH 7.5 5.0 - 8.0   Glucose, UA NEGATIVE NEGATIVE mg/dL   Hgb urine dipstick NEGATIVE NEGATIVE   Bilirubin Urine NEGATIVE NEGATIVE   Ketones, ur NEGATIVE NEGATIVE mg/dL   Protein, ur NEGATIVE NEGATIVE mg/dL   Urobilinogen, UA 1.0 0.0 - 1.0 mg/dL   Nitrite NEGATIVE NEGATIVE   Leukocytes, UA TRACE (A) NEGATIVE  Urine microscopic-add on     Status: Abnormal   Collection Time: 12/29/14  6:45 PM  Result Value Ref Range   Squamous Epithelial / LPF MANY (A) RARE   WBC, UA 0-2 <3 WBC/hpf   RBC / HPF 0-2 <3 RBC/hpf   Bacteria, UA FEW (A) RARE  Pregnancy, urine POC     Status: Abnormal   Collection Time: 12/29/14  7:19 PM  Result Value Ref Range   Preg Test, Ur POSITIVE (A) NEGATIVE    A: Positive pregnancy test  P: Discharge home Pregnancy confirmation letter given List of OB providers given First trimester warning signs discussed Patient may return to MAU as needed or if her condition were to change or worsen   Marny LowensteinJulie N Bard Haupert, PA-C 12/29/2014 8:16 PM

## 2015-01-10 ENCOUNTER — Encounter (HOSPITAL_COMMUNITY): Payer: Self-pay | Admitting: *Deleted

## 2015-01-10 ENCOUNTER — Inpatient Hospital Stay (HOSPITAL_COMMUNITY)
Admission: AD | Admit: 2015-01-10 | Discharge: 2015-01-10 | Disposition: A | Discharge status: Home / Self Care | Payer: BC Managed Care – PPO | Source: Ambulatory Visit | Attending: Obstetrics and Gynecology | Admitting: Obstetrics and Gynecology

## 2015-01-10 DIAGNOSIS — O210 Mild hyperemesis gravidarum: Secondary | ICD-10-CM | POA: Insufficient documentation

## 2015-01-10 DIAGNOSIS — O219 Vomiting of pregnancy, unspecified: Secondary | ICD-10-CM | POA: Diagnosis not present

## 2015-01-10 DIAGNOSIS — Z3A01 Less than 8 weeks gestation of pregnancy: Secondary | ICD-10-CM | POA: Insufficient documentation

## 2015-01-10 DIAGNOSIS — Z87891 Personal history of nicotine dependence: Secondary | ICD-10-CM | POA: Insufficient documentation

## 2015-01-10 LAB — URINALYSIS, ROUTINE W REFLEX MICROSCOPIC
BILIRUBIN URINE: NEGATIVE
GLUCOSE, UA: NEGATIVE mg/dL
HGB URINE DIPSTICK: NEGATIVE
KETONES UR: 15 mg/dL — AB
Leukocytes, UA: NEGATIVE
NITRITE: NEGATIVE
Protein, ur: NEGATIVE mg/dL
Specific Gravity, Urine: 1.015 (ref 1.005–1.030)
Urobilinogen, UA: 0.2 mg/dL (ref 0.0–1.0)
pH: 8 (ref 5.0–8.0)

## 2015-01-10 MED ORDER — PROMETHAZINE HCL 12.5 MG PO TABS: 12.5000 mg | ORAL_TABLET | Freq: Four times a day (QID) | ORAL | Status: DC | PRN

## 2015-01-10 MED ORDER — DOXYLAMINE-PYRIDOXINE 10-10 MG PO TBEC: DELAYED_RELEASE_TABLET | ORAL | Status: DC

## 2015-01-10 MED ORDER — DEXTROSE 5 % IN LACTATED RINGERS IV BOLUS
1000.0000 mL | Freq: Once | INTRAVENOUS | Status: AC
  Administered 2015-01-10: 1000 mL via INTRAVENOUS

## 2015-01-10 MED ORDER — PROMETHAZINE HCL 25 MG/ML IJ SOLN
12.5000 mg | Freq: Once | INTRAMUSCULAR | Status: AC
  Administered 2015-01-10: 12.5 mg via INTRAVENOUS
  Filled 2015-01-10: qty 1

## 2015-01-10 NOTE — MAU Provider Note (Signed)
None     Chief Complaint:  Morning Sickness   [name] is  24 y.o. G1P0 at [redacted]w[redacted]d presents complaining of Morning Sickness States has vomited 7 times today. Has not started [facility] yet.  Weight is stable from 2 weeks ago.  Not actively vomiting and does not look ill.  Obstetrical/Gynecological History: OB History    Gravida Para Term Preterm AB TAB SAB Ectopic Multiple Living   1              Past Medical History: Past Medical History  Diagnosis Date  . UTI (lower urinary tract infection)     recurrent  . Herniated disc     Past Surgical History: Past Surgical History  Procedure Laterality Date  . No past surgeries      Family History: History reviewed. No pertinent family history.  Social History: History  Substance Use Topics  . Smoking status: Former Smoker    Quit date: 11/09/2014  . Smokeless tobacco: Not on file  . Alcohol Use: Yes     Comment: not while pregnant    Allergies: No Known Allergies  Meds:  Prescriptions prior to admission  Medication Sig Dispense Refill Last Dose  . dimenhyDRINATE (DRAMAMINE) 50 MG tablet Take 25-50 mg by mouth every 8 (eight) hours as needed for nausea or dizziness.   01/10/2015 at Unknown time  . ibuprofen (ADVIL,MOTRIN) 200 MG tablet Take 400 mg by mouth every 6 (six) hours as needed for cramping.   Past Month at Unknown time  . ondansetron (ZOFRAN) 4 MG tablet Take 1 tablet (4 mg total) by mouth every 6 (six) hours. (Patient not taking: Reported on 01/10/2015) 6 tablet 0 Not Taking at Unknown time  . pantoprazole (PROTONIX) 40 MG tablet Take 1 tablet (40 mg total) by mouth 2 (two) times daily. (Patient not taking: Reported on 01/10/2015) 28 tablet 0 Not Taking at Unknown time    Review of Systems   Constitutional: Negative for fever and chills Eyes: Negative for visual disturbances Respiratory: Negative for shortness of breath, dyspnea Cardiovascular: Negative for chest pain or palpitations  Gastrointestinal: Negative for  diarrhea and constipation Genitourinary: Negative for dysuria and urgency Musculoskeletal: Negative for back pain, joint pain, myalgias  Neurological: Negative for dizziness and headaches     Physical Exam  Blood pressure 119/74, pulse 83, temperature 98.4 F (36.9 C), temperature source Oral, resp. rate 16, height  (1.727 m), weight 76.204 kg (168 lb), last menstrual period 11/23/2014. GENERAL: Well-developed, well-nourished female in no acute distress.  LUNGS: Clear to auscultation bilaterally.  HEART: Regular rate and rhythm. ABDOMEN: Soft, nontender, nondistended EXTREMITIES: Nontender, no edema, 2+ distal pulses. DTR's 2+  Labs: Results for orders placed or performed during the hospital encounter of 01/10/15 (from the past 24 hour(s))  Urinalysis, Routine w reflex microscopic   Collection Time: 01/10/15  2:50 PM  Result Value Ref Range   Color, Urine YELLOW YELLOW   APPearance CLEAR CLEAR   Specific Gravity, Urine 1.015 1.005 - 1.030   pH 8.0 5.0 - 8.0   Glucose, UA NEGATIVE NEGATIVE mg/dL   Hgb urine dipstick NEGATIVE NEGATIVE   Bilirubin Urine NEGATIVE NEGATIVE   Ketones, ur 15 (A) NEGATIVE mg/dL   Protein, ur NEGATIVE NEGATIVE mg/dL   Urobilinogen, UA 0.2 0.0 - 1.0 mg/dL   Nitrite NEGATIVE NEGATIVE   Leukocytes, UA NEGATIVE NEGATIVE   Imaging Studies:  No results found.  Assessment: [name] is  24 y.o. G1P0 at [redacted]w[redacted]d presents with  n/v of pregnancy.  Plan: 1000cc D5LR , IV phenergan.  Will send home rx for Diclegis and phenergan  CRESENZO-DISHMAN,[name] 2/1/20166:04 PM

## 2015-01-10 NOTE — MAU Note (Signed)
Urine in lab 

## 2015-01-10 NOTE — Discharge Instructions (Signed)
Morning Sickness Morning sickness is when you feel sick to your stomach (nauseous) during pregnancy. This nauseous feeling may or may not come with vomiting. It often occurs in the morning but can be a problem any time of day. Morning sickness is most common during the first trimester, but it may continue throughout pregnancy. While morning sickness is unpleasant, it is usually harmless unless you develop severe and continual vomiting (hyperemesis gravidarum). This condition requires more intense treatment.  CAUSES  The cause of morning sickness is not completely known but seems to be related to normal hormonal changes that occur in pregnancy. RISK FACTORS You are at greater risk if you:  Experienced nausea or vomiting before your pregnancy.  Had morning sickness during a previous pregnancy.  Are pregnant with more than one baby, such as twins. TREATMENT  Do not use any medicines (prescription, over-the-counter, or herbal) for morning sickness without first talking to your health care provider. Your health care provider may prescribe or recommend:  Vitamin B6 supplements.  Anti-nausea medicines.  The herbal medicine ginger. HOME CARE INSTRUCTIONS   Only take over-the-counter or prescription medicines as directed by your health care provider.  Taking multivitamins before getting pregnant can prevent or decrease the severity of morning sickness in most women.  Eat a piece of dry toast or unsalted crackers before getting out of bed in the morning.  Eat five or six small meals a day.  Eat dry and bland foods (rice, baked potato). Foods high in carbohydrates are often helpful.  Do not drink liquids with your meals. Drink liquids between meals.  Avoid greasy, fatty, and spicy foods.  Get someone to cook for you if the smell of any food causes nausea and vomiting.  If you feel nauseous after taking prenatal vitamins, take the vitamins at night or with a snack.  Snack on protein  foods (nuts, yogurt, cheese) between meals if you are hungry.  Eat unsweetened gelatins for desserts.  Wearing an acupressure wristband (worn for sea sickness) may be helpful.  Acupuncture may be helpful.  Do not smoke.  Get a humidifier to keep the air in your house free of odors.  Get plenty of fresh air. SEEK MEDICAL CARE IF:   Your home remedies are not working, and you need medicine.  You feel dizzy or lightheaded.  You are losing weight. SEEK IMMEDIATE MEDICAL CARE IF:   You have persistent and uncontrolled nausea and vomiting.  You pass out (faint). MAKE SURE YOU:  Understand these instructions.  Will watch your condition.  Will get help right away if you are not doing well or get worse. Document Released: 01/17/2007 Document Revised: 12/01/2013 Document Reviewed: 05/13/2013 [facility] Patient Information 2015 [place], [place]. This information is not intended to replace advice given to you by your health care provider. Make sure you discuss any questions you have with your health care provider.  Hyperemesis Gravidarum Hyperemesis gravidarum is a severe form of nausea and vomiting that happens during pregnancy. Hyperemesis is worse than morning sickness. It may cause you to have nausea or vomiting all day for many days. It may keep you from eating and drinking enough food and liquids. Hyperemesis usually occurs during the first half (the first 20 weeks) of pregnancy. It often goes away once a woman is in her second half of pregnancy. However, sometimes hyperemesis continues through an entire pregnancy.  CAUSES  The cause of this condition is not completely known but is thought to be related to changes in  the body's hormones when pregnant. It could be from the high level of the pregnancy hormone or an increase in estrogen in the body.  SIGNS AND SYMPTOMS   Severe nausea and vomiting.  Nausea that does not go away.  Vomiting that does not allow you to keep any food  down.  Weight loss and body fluid loss (dehydration).  Having no desire to eat or not liking food you have previously enjoyed. DIAGNOSIS  Your health care provider will do a physical exam and ask you about your symptoms. He or she may also order blood tests and urine tests to make sure something else is not causing the problem.  TREATMENT  You may only need medicine to control the problem. If medicines do not control the nausea and vomiting, you will be treated in the hospital to prevent dehydration, increased acid in the blood (acidosis), weight loss, and changes in the electrolytes in your body that may harm the unborn baby (fetus). You may need IV fluids.  HOME CARE INSTRUCTIONS   Only take over-the-counter or prescription medicines as directed by your health care provider.  Try eating a couple of dry crackers or toast in the morning before getting out of bed.  Avoid foods and smells that upset your stomach.  Avoid fatty and spicy foods.  Eat 5-6 small meals a day.  Do not drink when eating meals. Drink between meals.  For snacks, eat high-protein foods, such as cheese.  Eat or suck on things that have ginger in them. Ginger helps nausea.  Avoid food preparation. The smell of food can spoil your appetite.  Avoid iron pills and iron in your multivitamins until after 3-4 months of being pregnant. However, consult with your health care provider before stopping any prescribed iron pills. SEEK MEDICAL CARE IF:   Your abdominal pain increases.  You have a severe headache.  You have vision problems.  You are losing weight. SEEK IMMEDIATE MEDICAL CARE IF:   You are unable to keep fluids down.  You vomit blood.  You have constant nausea and vomiting.  You have excessive weakness.  You have extreme thirst.  You have dizziness or fainting.  You have a fever or persistent symptoms for more than 2-3 days.  You have a fever and your symptoms suddenly get worse. MAKE SURE  YOU:   Understand these instructions.  Will watch your condition.  Will get help right away if you are not doing well or get worse. Document Released: 11/26/2005 Document Revised: 09/16/2013 Document Reviewed: 07/08/2013 [facility] Patient Information 2015 [place], [place]. This information is not intended to replace advice given to you by your health care provider. Make sure you discuss any questions you have with your health care provider. Eating Plan for Hyperemesis Gravidarum Severe cases of hyperemesis gravidarum can lead to dehydration and malnutrition. The hyperemesis eating plan is one way to lessen the symptoms of nausea and vomiting. It is often used with prescribed medicines to control your symptoms.  WHAT CAN I DO TO RELIEVE MY SYMPTOMS? Listen to your body. Everyone is different and has different preferences. Find what works best for you. Some of the following things may help:  Eat and drink slowly.  Eat 5-6 small meals daily instead of 3 large meals.   Eat crackers before you get out of bed in the morning.   Starchy foods are usually well tolerated (such as cereal, toast, bread, potatoes, pasta, rice, and pretzels).   Ginger may help with nausea. Add  tsp ground ginger to hot tea or choose ginger tea.   Try drinking 100% fruit juice or an electrolyte drink.  Continue to take your prenatal vitamins as directed by your health care provider. If you are having trouble taking your prenatal vitamins, talk with your health care provider about different options.  Include at least 1 serving of protein with your meals and snacks (such as meats or poultry, beans, nuts, eggs, or yogurt). Try eating a protein-rich snack before bed (such as cheese and crackers or a half [place] or peanut butter sandwich). WHAT THINGS SHOULD I AVOID TO REDUCE MY SYMPTOMS? The following things may help reduce your symptoms:  Avoid foods with strong smells. Try eating meals in well-ventilated areas that  are free of odors.  Avoid drinking water or other beverages with meals. Try not to drink anything less than 30 minutes before and after meals.  Avoid drinking more than 1 cup of fluid at a time.  Avoid fried or high-fat foods, such as butter and cream sauces.  Avoid spicy foods.  Avoid skipping meals the best you can. Nausea can be more intense on an empty stomach. If you cannot tolerate food at that time, do not force it. Try sucking on ice chips or other frozen items and make up the calories later.  Avoid lying down within 2 hours after eating. Document Released: 09/23/2007 Document Revised: 12/01/2013 Document Reviewed: 09/30/2013 [facility] Patient Information 2015 [place], [place]. This information is not intended to replace advice given to you by your health care provider. Make sure you discuss any questions you have with your health care provider.

## 2015-01-10 NOTE — MAU Note (Signed)
Pt presents complaining of nausea and vomiting for 1 week. States she can't keep anything down. Feels dizzy and light headed. Denies vaginal bleeding or discharge.

## 2015-01-11 ENCOUNTER — Encounter (HOSPITAL_COMMUNITY): Payer: Self-pay | Admitting: *Deleted

## 2015-01-11 ENCOUNTER — Inpatient Hospital Stay (HOSPITAL_COMMUNITY)
Admission: AD | Admit: 2015-01-11 | Discharge: 2015-01-11 | Disposition: A | Discharge status: Home / Self Care | Payer: BC Managed Care – PPO | Source: Ambulatory Visit | Attending: Family Medicine | Admitting: Family Medicine

## 2015-01-11 DIAGNOSIS — O219 Vomiting of pregnancy, unspecified: Secondary | ICD-10-CM | POA: Diagnosis not present

## 2015-01-11 DIAGNOSIS — R109 Unspecified abdominal pain: Secondary | ICD-10-CM | POA: Diagnosis present

## 2015-01-11 DIAGNOSIS — Z87891 Personal history of nicotine dependence: Secondary | ICD-10-CM | POA: Diagnosis not present

## 2015-01-11 DIAGNOSIS — O210 Mild hyperemesis gravidarum: Secondary | ICD-10-CM | POA: Insufficient documentation

## 2015-01-11 DIAGNOSIS — Z3A01 Less than 8 weeks gestation of pregnancy: Secondary | ICD-10-CM | POA: Diagnosis not present

## 2015-01-11 LAB — URINALYSIS, ROUTINE W REFLEX MICROSCOPIC
Bilirubin Urine: NEGATIVE
GLUCOSE, UA: NEGATIVE mg/dL
HGB URINE DIPSTICK: NEGATIVE
Ketones, ur: 15 mg/dL — AB
Nitrite: NEGATIVE
PH: 7 (ref 5.0–8.0)
PROTEIN: NEGATIVE mg/dL
SPECIFIC GRAVITY, URINE: 1.02 (ref 1.005–1.030)
Urobilinogen, UA: 1 mg/dL (ref 0.0–1.0)

## 2015-01-11 LAB — URINE MICROSCOPIC-ADD ON

## 2015-01-11 MED ORDER — FAMOTIDINE IN NACL 20-0.9 MG/50ML-% IV SOLN
20.0000 mg | Freq: Once | INTRAVENOUS | Status: AC
  Administered 2015-01-11: 20 mg via INTRAVENOUS
  Filled 2015-01-11: qty 50

## 2015-01-11 MED ORDER — PROMETHAZINE HCL 25 MG/ML IJ SOLN
25.0000 mg | INTRAMUSCULAR | Status: AC
  Administered 2015-01-11: 25 mg via INTRAMUSCULAR
  Filled 2015-01-11: qty 1

## 2015-01-11 MED ORDER — LACTATED RINGERS IV BOLUS (SEPSIS)
1000.0000 mL | Freq: Once | INTRAVENOUS | Status: AC
  Administered 2015-01-11: 1000 mL via INTRAVENOUS

## 2015-01-11 NOTE — Discharge Instructions (Signed)
Continue to use Diclegis as prescribed.                                                                                                                                                                         Continue to use Phenergan as prescribed.   May use OTC Tums or Zantac PRN per box directions  Hyperemesis Gravidarum Hyperemesis gravidarum is a severe form of nausea and vomiting that happens during pregnancy. Hyperemesis is worse than morning sickness. It may cause you to have nausea or vomiting all day for many days. It may keep you from eating and drinking enough food and liquids. Hyperemesis usually occurs during the first half (the first 20 weeks) of pregnancy. It often goes away once a woman is in her second half of pregnancy. However, sometimes hyperemesis continues through an entire pregnancy.  CAUSES  The cause of this condition is not completely known but is thought to be related to changes in the body's hormones when pregnant. It could be from the high level of the pregnancy hormone or an increase in estrogen in the body.  SIGNS AND SYMPTOMS   Severe nausea and vomiting.  Nausea that does not go away.  Vomiting that does not allow you to keep any food down.  Weight loss and body fluid loss (dehydration).  Having no desire to eat or not liking food you have previously enjoyed. DIAGNOSIS  Your health care provider will do a physical exam and ask you about your symptoms. He or she may also order blood tests and urine tests to make sure something else is not causing the problem.  TREATMENT  You may only need medicine to control the problem. If medicines do not control the nausea and vomiting, you will be treated in the hospital to prevent dehydration, increased acid in the blood (acidosis), weight loss, and changes in the electrolytes in your body that may harm the unborn baby (fetus). You may need IV fluids.  HOME CARE INSTRUCTIONS   Only take over-the-counter or prescription  medicines as directed by your health care provider.  Try eating a couple of dry crackers or toast in the morning before getting out of bed.  Avoid foods and smells that upset your stomach.  Avoid fatty and spicy foods.  Eat 5-6 small meals a day.  Do not drink when eating meals. Drink between meals.  For snacks, eat high-protein foods, such as cheese.  Eat or suck on things that have ginger in them. Ginger helps nausea.  Avoid food preparation. The smell of food can spoil your appetite.  Avoid iron pills and iron in your multivitamins until after 3-4 months of being pregnant. However, consult with your health care provider before stopping any  prescribed iron pills. SEEK MEDICAL CARE IF:   Your abdominal pain increases.  You have a severe headache.  You have vision problems.  You are losing weight. SEEK IMMEDIATE MEDICAL CARE IF:   You are unable to keep fluids down.  You vomit blood.  You have constant nausea and vomiting.  You have excessive weakness.  You have extreme thirst.  You have dizziness or fainting.  You have a fever or persistent symptoms for more than 2-3 days.  You have a fever and your symptoms suddenly get worse. MAKE SURE YOU:   Understand these instructions.  Will watch your condition.  Will get help right away if you are not doing well or get worse. Document Released: 11/26/2005 Document Revised: 09/16/2013 Document Reviewed: 07/08/2013 [facility]ExitCare Patient Information 2015 [place], [place]. This information is not intended to replace advice given to you by your health care provider. Make sure you discuss any questions you have with your health care provider.

## 2015-01-11 NOTE — MAU Note (Signed)
Was here yesterday, got fluids and meds.  Took meds today, then started throwing up, noted "a lot of blood in it".  Stomach really hurts.

## 2015-01-11 NOTE — MAU Note (Signed)
C/o N&V-seen in MAU yesterday and given rx for promethazine; pt used the rx for diclegis with little to no relief; pt did not try the promethazine last night or this morning;

## 2015-01-11 NOTE — MAU Provider Note (Signed)
History     CSN: [id number]  Arrival date and time: 01/11/15 [id number]   First Provider Initiated Contact with Patient 01/11/15 1210      Chief Complaint  Patient presents with  . Abdominal Pain  . Emesis During Pregnancy   HPI [name] 24 y.o. G1P0 @[redacted]w[redacted]d  presents to MAU complaining of vomiting.  She was seen yesterday and given rx for phenergan and diclegis.  The diclegis was helpful last night but she has vomited 8-9 times so far today.  She thought she could not use both meds together.  She has a burning sensation in stomach.  Denies vag bleeding/discharge, weakness, fever, dysuria.   OB History    Gravida Para Term Preterm AB TAB SAB Ectopic Multiple Living   1               Past Medical History  Diagnosis Date  . UTI (lower urinary tract infection)     recurrent  . Herniated disc     Past Surgical History  Procedure Laterality Date  . No past surgeries      Family History  Problem Relation Age of Onset  . Hypertension Mother   . Hypertension Maternal Grandmother   . Hypertension Maternal Grandfather     History  Substance Use Topics  . Smoking status: Former Smoker    Quit date: 11/09/2014  . Smokeless tobacco: Not on file  . Alcohol Use: Yes     Comment: not while pregnant    Allergies: No Known Allergies  Prescriptions prior to admission  Medication Sig Dispense Refill Last Dose  . Doxylamine-Pyridoxine 10-10 MG TBEC 2 PO qhs; may take 1po in am and 1po in afternoon prn nausea 120 tablet 3 01/11/2015 at Unknown time  . promethazine (PHENERGAN) 12.5 MG tablet Take 1 tablet (12.5 mg total) by mouth every 6 (six) hours as needed for nausea or vomiting. 30 tablet 0 01/10/2015 at Unknown time    ROS Pertinent ROS in HPI  Physical Exam   Blood pressure 134/87, pulse 78, temperature 99.3 F (37.4 C), temperature source Oral, resp. rate 17, height 5' 6.5" (1.689 m), weight 167 lb (75.751 kg), last menstrual period 11/23/2014, SpO2 100 %.  Physical Exam   Constitutional: She is oriented to person, place, and time. She appears well-developed and well-nourished. No distress.  HENT:  Head: Normocephalic and atraumatic.  Eyes: EOM are normal.  Neck: Normal range of motion.  Cardiovascular: Normal rate, regular rhythm and normal heart sounds.   Respiratory: Effort normal and breath sounds normal. No respiratory distress.  GI: Soft. Bowel sounds are normal. She exhibits no distension. There is no rebound and no guarding.  General soreness throughout  Musculoskeletal: Normal range of motion.  Neurological: She is alert and oriented to person, place, and time.  Skin: Skin is warm and dry.  Psychiatric: She has a normal mood and affect.   Results for orders placed or performed during the hospital encounter of 01/11/15 (from the past 24 hour(s))  Urinalysis, Routine w reflex microscopic     Status: Abnormal   Collection Time: 01/11/15  9:21 AM  Result Value Ref Range   Color, Urine YELLOW YELLOW   APPearance CLEAR CLEAR   Specific Gravity, Urine 1.020 1.005 - 1.030   pH 7.0 5.0 - 8.0   Glucose, UA NEGATIVE NEGATIVE mg/dL   Hgb urine dipstick NEGATIVE NEGATIVE   Bilirubin Urine NEGATIVE NEGATIVE   Ketones, ur 15 (A) NEGATIVE mg/dL   Protein, ur NEGATIVE NEGATIVE  mg/dL   Urobilinogen, UA 1.0 0.0 - 1.0 mg/dL   Nitrite NEGATIVE NEGATIVE   Leukocytes, UA SMALL (A) NEGATIVE  Urine microscopic-add on     Status: None   Collection Time: 01/11/15  9:21 AM  Result Value Ref Range   Squamous Epithelial / LPF RARE RARE   WBC, UA 0-2 <3 WBC/hpf   RBC / HPF 0-2 <3 RBC/hpf     MAU Course  Procedures  MDM IM phenergan ordered as pt did not believe she could hold down oral.   Initially noted improvement and asked for po fluids.   She then became nauseous and vomited and complains of burning.   IV fluids and Pepcid IV ordered. Pt notes improvement but still feeling nausea.  She is agreeable to discharge.    Assessment and Plan  A:  1. Nausea  and vomiting in pregnancy    P: Discharge to home Use Diclegis and Phenergan prescribed from visit yesterday Okay to use OTC Tums and/or Zantac prn reflux Diet discussed Obtain [facility] asap Patient may return to MAU as needed or if her condition were to change or worsen   Bertram Denver 01/11/2015, 12:13 PM

## 2015-02-11 ENCOUNTER — Inpatient Hospital Stay (HOSPITAL_COMMUNITY)
Admission: AD | Admit: 2015-02-11 | Discharge: 2015-02-11 | Disposition: A | Discharge status: Home / Self Care | Payer: BC Managed Care – PPO | Source: Ambulatory Visit | Attending: Obstetrics & Gynecology | Admitting: Obstetrics & Gynecology

## 2015-02-11 ENCOUNTER — Encounter (HOSPITAL_COMMUNITY): Payer: Self-pay | Admitting: General Practice

## 2015-02-11 DIAGNOSIS — O219 Vomiting of pregnancy, unspecified: Secondary | ICD-10-CM | POA: Diagnosis not present

## 2015-02-11 DIAGNOSIS — Z87891 Personal history of nicotine dependence: Secondary | ICD-10-CM | POA: Insufficient documentation

## 2015-02-11 DIAGNOSIS — O210 Mild hyperemesis gravidarum: Secondary | ICD-10-CM | POA: Insufficient documentation

## 2015-02-11 DIAGNOSIS — Z3A11 11 weeks gestation of pregnancy: Secondary | ICD-10-CM | POA: Diagnosis not present

## 2015-02-11 HISTORY — DX: Other specified health status: Z78.9

## 2015-02-11 LAB — URINALYSIS, ROUTINE W REFLEX MICROSCOPIC
Bilirubin Urine: NEGATIVE
Glucose, UA: NEGATIVE mg/dL
HGB URINE DIPSTICK: NEGATIVE
Ketones, ur: NEGATIVE mg/dL
Leukocytes, UA: NEGATIVE
Nitrite: NEGATIVE
PROTEIN: NEGATIVE mg/dL
Specific Gravity, Urine: 1.01 (ref 1.005–1.030)
UROBILINOGEN UA: 1 mg/dL (ref 0.0–1.0)
pH: 7 (ref 5.0–8.0)

## 2015-02-11 LAB — CBC WITH DIFFERENTIAL/PLATELET
BASOS ABS: 0 10*3/uL (ref 0.0–0.1)
BASOS PCT: 0 % (ref 0–1)
EOS PCT: 0 % (ref 0–5)
Eosinophils Absolute: 0.1 10*3/uL (ref 0.0–0.7)
HCT: 36 % (ref 36.0–46.0)
Hemoglobin: 12.3 g/dL (ref 12.0–15.0)
Lymphocytes Relative: 13 % (ref 12–46)
Lymphs Abs: 2.3 10*3/uL (ref 0.7–4.0)
MCH: 30.9 pg (ref 26.0–34.0)
MCHC: 34.2 g/dL (ref 30.0–36.0)
MCV: 90.5 fL (ref 78.0–100.0)
MONO ABS: 0.8 10*3/uL (ref 0.1–1.0)
Monocytes Relative: 4 % (ref 3–12)
Neutro Abs: 14.1 10*3/uL — ABNORMAL HIGH (ref 1.7–7.7)
Neutrophils Relative %: 82 % — ABNORMAL HIGH (ref 43–77)
PLATELETS: 274 10*3/uL (ref 150–400)
RBC: 3.98 MIL/uL (ref 3.87–5.11)
RDW: 12 % (ref 11.5–15.5)
WBC: 17.2 10*3/uL — ABNORMAL HIGH (ref 4.0–10.5)

## 2015-02-11 LAB — COMPREHENSIVE METABOLIC PANEL
ALT: 18 U/L (ref 0–35)
ANION GAP: 7 (ref 5–15)
AST: 19 U/L (ref 0–37)
Albumin: 4.1 g/dL (ref 3.5–5.2)
Alkaline Phosphatase: 45 U/L (ref 39–117)
BILIRUBIN TOTAL: 0.9 mg/dL (ref 0.3–1.2)
BUN: 8 mg/dL (ref 6–23)
CO2: 27 mmol/L (ref 19–32)
Calcium: 9.3 mg/dL (ref 8.4–10.5)
Chloride: 102 mmol/L (ref 96–112)
Creatinine, Ser: 0.52 mg/dL (ref 0.50–1.10)
GFR calc non Af Amer: >90 mL/min (ref >90)
GLUCOSE: 84 mg/dL (ref 70–99)
Potassium: 4.2 mmol/L (ref 3.5–5.1)
Sodium: 136 mmol/L (ref 135–145)
TOTAL PROTEIN: 7 g/dL (ref 6.0–8.3)

## 2015-02-11 MED ORDER — FAMOTIDINE 20 MG PO TABS: 20.0000 mg | ORAL_TABLET | Freq: Two times a day (BID) | ORAL | Status: DC

## 2015-02-11 MED ORDER — FAMOTIDINE IN NACL 20-0.9 MG/50ML-% IV SOLN
20.0000 mg | Freq: Once | INTRAVENOUS | Status: AC
  Administered 2015-02-11: 20 mg via INTRAVENOUS
  Filled 2015-02-11: qty 50

## 2015-02-11 MED ORDER — PROMETHAZINE HCL 25 MG/ML IJ SOLN
25.0000 mg | Freq: Once | INTRAVENOUS | Status: AC
  Administered 2015-02-11: 25 mg via INTRAVENOUS
  Filled 2015-02-11: qty 1

## 2015-02-11 NOTE — MAU Provider Note (Signed)
History     CSN: [id number]  Arrival date and time: 02/11/15 [id number]   First Provider Initiated Contact with Patient 02/11/15 1104      Chief Complaint  Patient presents with  . Emesis During Pregnancy   HPI  Ms. [name] is a 24 y.o. G1P0 at 3971w3d who presents to MAU today with complaint of nausea and vomiting. The patient states this has been going on x weeks. She states minimal intake today. She endorses 1 loose stool, but denies diarrhea or constipation. She states occasional mild upper abdominal pain. She was seen previously and given phenergan, which she stopped because she felt "it made my stomach burn." She is also taking Diclegis QID without relief. She denies sick contacts. She endorses heartburn.   OB History    Gravida Para Term Preterm AB TAB SAB Ectopic Multiple Living   1               Past Medical History  Diagnosis Date  . UTI (lower urinary tract infection)     recurrent  . Herniated disc   . Medical history non-contributory     Past Surgical History  Procedure Laterality Date  . No past surgeries      Family History  Problem Relation Age of Onset  . Hypertension Mother   . Hypertension Maternal Grandmother   . Hypertension Maternal Grandfather     History  Substance Use Topics  . Smoking status: Former Smoker    Quit date: 11/09/2014  . Smokeless tobacco: Not on file  . Alcohol Use: Yes     Comment: not while pregnant    Allergies: No Known Allergies  No prescriptions prior to admission    Review of Systems  Constitutional: Negative for fever and malaise/fatigue.  Gastrointestinal: Positive for nausea, vomiting and abdominal pain. Negative for diarrhea and constipation.  Genitourinary: Negative for dysuria, urgency and frequency.       Neg - vaginal bleeding, discharge   Physical Exam   Blood pressure 128/71, pulse 73, temperature 98.9 F (37.2 C), temperature source Oral, resp. rate 18, height 5\' 8"  (1.727 m), weight 171 lb 9.6  oz (77.837 kg), last menstrual period 11/23/2014.  Physical Exam  Constitutional: She is oriented to person, place, and time. She appears well-developed and well-nourished. No distress.  HENT:  Head: Normocephalic.  Cardiovascular: Normal rate.   Respiratory: Effort normal.  GI: Soft. Bowel sounds are normal. She exhibits no distension and no mass. There is tenderness (mild epigastric tenderness). There is no rebound and no guarding.  Neurological: She is alert and oriented to person, place, and time.  Skin: Skin is warm and dry. No erythema.  Psychiatric: She has a normal mood and affect.   Results for orders placed or performed during the hospital encounter of 02/11/15 (from the past 24 hour(s))  Urinalysis, Routine w reflex microscopic     Status: None   Collection Time: 02/11/15  9:40 AM  Result Value Ref Range   Color, Urine YELLOW YELLOW   APPearance CLEAR CLEAR   Specific Gravity, Urine 1.010 1.005 - 1.030   pH 7.0 5.0 - 8.0   Glucose, UA NEGATIVE NEGATIVE mg/dL   Hgb urine dipstick NEGATIVE NEGATIVE   Bilirubin Urine NEGATIVE NEGATIVE   Ketones, ur NEGATIVE NEGATIVE mg/dL   Protein, ur NEGATIVE NEGATIVE mg/dL   Urobilinogen, UA 1.0 0.0 - 1.0 mg/dL   Nitrite NEGATIVE NEGATIVE   Leukocytes, UA NEGATIVE NEGATIVE  CBC with Differential/Platelet  Status: Abnormal   Collection Time: 02/11/15 11:22 AM  Result Value Ref Range   WBC 17.2 (H) 4.0 - 10.5 K/uL   RBC 3.98 3.87 - 5.11 MIL/uL   Hemoglobin 12.3 12.0 - 15.0 g/dL   HCT 16.1 09.6 - 04.5 %   MCV 90.5 78.0 - 100.0 fL   MCH 30.9 26.0 - 34.0 pg   MCHC 34.2 30.0 - 36.0 g/dL   RDW 40.9 81.1 - 91.4 %   Platelets 274 150 - 400 K/uL   Neutrophils Relative % 82 (H) 43 - 77 %   Neutro Abs 14.1 (H) 1.7 - 7.7 K/uL   Lymphocytes Relative 13 12 - 46 %   Lymphs Abs 2.3 0.7 - 4.0 K/uL   Monocytes Relative 4 3 - 12 %   Monocytes Absolute 0.8 0.1 - 1.0 K/uL   Eosinophils Relative 0 0 - 5 %   Eosinophils Absolute 0.1 0.0 - 0.7  K/uL   Basophils Relative 0 0 - 1 %   Basophils Absolute 0.0 0.0 - 0.1 K/uL  Comprehensive metabolic panel     Status: None   Collection Time: 02/11/15 11:22 AM  Result Value Ref Range   Sodium 136 135 - 145 mmol/L   Potassium 4.2 3.5 - 5.1 mmol/L   Chloride 102 96 - 112 mmol/L   CO2 27 19 - 32 mmol/L   Glucose, Bld 84 70 - 99 mg/dL   BUN 8 6 - 23 mg/dL   Creatinine, Ser 7.82 0.50 - 1.10 mg/dL   Calcium 9.3 8.4 - 95.6 mg/dL   Total Protein 7.0 6.0 - 8.3 g/dL   Albumin 4.1 3.5 - 5.2 g/dL   AST 19 0 - 37 U/L   ALT 18 0 - 35 U/L   Alkaline Phosphatase 45 39 - 117 U/L   Total Bilirubin 0.9 0.3 - 1.2 mg/dL   GFR calc non Af Amer >90 >90 mL/min   GFR calc Af Amer >90 >90 mL/min   Anion gap 7 5 - 15    MAU Course  Procedures None  MDM 1 liter IV LR with 25 mg phenergan infusion and 20 mg IV pepcid Patient reports improvement in symptoms and is able to tolerate PO while in MAU  Assessment and Plan  A: Nausea and vomiting in pregnancy prior to [redacted] weeks gestation   P: Discharge home Patient advised to continue Phenergan PRN for nausea Rx for Pepcid given and advised to be taken daily to BID Information about diet for N/V in pregnancy given Patient is already scheduled to start prenatal care with [facility]. Advised to keep appoinment.  Patient may return to MAU as needed or if her condition were to change or worsen   Marny Lowenstein, PA-C  02/11/2015, 2:51 PM

## 2015-02-11 NOTE — Discharge Instructions (Signed)
Morning Sickness °Morning sickness is when you feel sick to your stomach (nauseous) during pregnancy. You may feel sick to your stomach and throw up (vomit). You may feel sick in the morning, but you can feel this way any time of day. Some women feel very sick to their stomach and cannot stop throwing up (hyperemesis gravidarum). °HOME CARE °· Only take medicines as told by your doctor. °· Take multivitamins as told by your doctor. Taking multivitamins before getting pregnant can stop or lessen the harshness of morning sickness. °· Eat dry toast or unsalted crackers before getting out of bed. °· Eat 5 to 6 small meals a day. °· Eat dry and bland foods like rice and baked potatoes. °· Do not drink liquids with meals. Drink between meals. °· Do not eat greasy, fatty, or spicy foods. °· Have someone cook for you if the smell of food causes you to feel sick or throw up. °· If you feel sick to your stomach after taking prenatal vitamins, take them at night or with a snack. °· Eat protein when you need a snack (nuts, yogurt, cheese). °· Eat unsweetened gelatins for dessert. °· Wear a bracelet used for sea sickness (acupressure wristband). °· Go to a doctor that puts thin needles into certain body points (acupuncture) to improve how you feel. °· Do not smoke. °· Use a humidifier to keep the air in your house free of odors. °· Get lots of fresh air. °GET HELP IF: °· You need medicine to feel better. °· You feel dizzy or lightheaded. °· You are losing weight. °GET HELP RIGHT AWAY IF:  °· You feel very sick to your stomach and cannot stop throwing up. °· You pass out (faint). °MAKE SURE YOU: °· Understand these instructions. °· Will watch your condition. °· Will get help right away if you are not doing well or get worse. °Document Released: 01/03/2005 Document Revised: 12/01/2013 Document Reviewed: 05/13/2013 °[employer]® Patient Information ©2015 [employer]. This information is not intended to replace advice given to you by  your health care provider. Make sure you discuss any questions you have with your health care provider. ° °Eating Plan for Hyperemesis Gravidarum °Severe cases of hyperemesis gravidarum can lead to dehydration and malnutrition. The hyperemesis eating plan is one way to lessen the symptoms of nausea and vomiting. It is often used with prescribed medicines to control your symptoms.  °WHAT CAN I DO TO RELIEVE MY SYMPTOMS? °Listen to your body. Everyone is different and has different preferences. Find what works best for you. Some of the following things may help: °· Eat and drink slowly. °· Eat 5-6 small meals daily instead of 3 large meals.   °· Eat crackers before you get out of bed in the morning.   °· Starchy foods are usually well tolerated (such as cereal, toast, bread, potatoes, pasta, rice, and pretzels).   °· Ginger may help with nausea. Add ¼ tsp ground ginger to hot tea or choose ginger tea.   °· Try drinking 100% fruit juice or an electrolyte drink. °· Continue to take your prenatal vitamins as directed by your health care provider. If you are having trouble taking your prenatal vitamins, talk with your health care provider about different options. °· Include at least 1 serving of protein with your meals and snacks (such as meats or poultry, beans, nuts, eggs, or yogurt). Try eating a protein-rich snack before bed (such as cheese and crackers or a half turkey or peanut butter sandwich). °WHAT THINGS SHOULD I   AVOID TO REDUCE MY SYMPTOMS? °The following things may help reduce your symptoms: °· Avoid foods with strong smells. Try eating meals in well-ventilated areas that are free of odors. °· Avoid drinking water or other beverages with meals. Try not to drink anything less than 30 minutes before and after meals. °· Avoid drinking more than 1 cup of fluid at a time. °· Avoid fried or high-fat foods, such as butter and cream sauces. °· Avoid spicy foods. °· Avoid skipping meals the best you can. Nausea can be  more intense on an empty stomach. If you cannot tolerate food at that time, do not force it. Try sucking on ice chips or other frozen items and make up the calories later. °· Avoid lying down within 2 hours after eating. °Document Released: 09/23/2007 Document Revised: 12/01/2013 Document Reviewed: 09/30/2013 °[employer]® Patient Information ©2015 [employer]. This information is not intended to replace advice given to you by your health care provider. Make sure you discuss any questions you have with your health care provider. ° °

## 2015-02-11 NOTE — MAU Note (Signed)
Has appt @ clinic on 3/30, wants evaluation today.  Has had vomiting for the past 6-7 weeks, has had one diarrhea stool in the last 24 hours - was constipated before.  Denies pain.  Occasional coughing.  Denies bleeding. Received prescriptions for diclegis & phenergan.  States Phenergan makes her stomach burn, diclegis is ineffective.

## 2015-03-09 ENCOUNTER — Encounter: Payer: Self-pay | Admitting: Advanced Practice Midwife

## 2015-03-09 ENCOUNTER — Ambulatory Visit (INDEPENDENT_AMBULATORY_CARE_PROVIDER_SITE_OTHER): Payer: BC Managed Care – PPO | Admitting: Advanced Practice Midwife

## 2015-03-09 VITALS — BP 119/73 | HR 74 | Wt 169.7 lb

## 2015-03-09 DIAGNOSIS — Z3482 Encounter for supervision of other normal pregnancy, second trimester: Secondary | ICD-10-CM

## 2015-03-09 DIAGNOSIS — Z23 Encounter for immunization: Secondary | ICD-10-CM | POA: Diagnosis not present

## 2015-03-09 DIAGNOSIS — O219 Vomiting of pregnancy, unspecified: Secondary | ICD-10-CM

## 2015-03-09 DIAGNOSIS — N898 Other specified noninflammatory disorders of vagina: Secondary | ICD-10-CM

## 2015-03-09 DIAGNOSIS — O26892 Other specified pregnancy related conditions, second trimester: Secondary | ICD-10-CM

## 2015-03-09 DIAGNOSIS — Z3492 Encounter for supervision of normal pregnancy, unspecified, second trimester: Secondary | ICD-10-CM

## 2015-03-09 LAB — POCT URINALYSIS DIP (DEVICE)
Bilirubin Urine: NEGATIVE
GLUCOSE, UA: NEGATIVE mg/dL
HGB URINE DIPSTICK: NEGATIVE
Ketones, ur: NEGATIVE mg/dL
Nitrite: NEGATIVE
PH: 6 (ref 5.0–8.0)
Protein, ur: NEGATIVE mg/dL
SPECIFIC GRAVITY, URINE: 1.01 (ref 1.005–1.030)
UROBILINOGEN UA: 0.2 mg/dL (ref 0.0–1.0)

## 2015-03-09 LAB — OB RESULTS CONSOLE GC/CHLAMYDIA
Chlamydia: NEGATIVE
Gonorrhea: NEGATIVE

## 2015-03-09 MED ORDER — ONDANSETRON 8 MG PO TBDP: 8.0000 mg | ORAL_TABLET | Freq: Three times a day (TID) | ORAL | Status: DC | PRN

## 2015-03-09 NOTE — Progress Notes (Signed)
Trace leuks on urine

## 2015-03-09 NOTE — Patient Instructions (Signed)
Second Trimester of Pregnancy The second trimester is from week 13 through week 28, months 4 through 6. The second trimester is often a time when you feel your best. Your body has also adjusted to being pregnant, and you begin to feel better physically. Usually, morning sickness has lessened or quit completely, you may have more energy, and you may have an increase in appetite. The second trimester is also a time when the fetus is growing rapidly. At the end of the sixth month, the fetus is about 9 inches long and weighs about 1 pounds. You will likely begin to feel the baby move (quickening) between 18 and 20 weeks of the pregnancy. BODY CHANGES Your body goes through many changes during pregnancy. The changes vary from woman to woman.   Your weight will continue to increase. You will notice your lower abdomen bulging out.  You may begin to get stretch marks on your hips, abdomen, and breasts.  You may develop headaches that can be relieved by medicines approved by your health care provider.  You may urinate more often because the fetus is pressing on your bladder.  You may develop or continue to have heartburn as a result of your pregnancy.  You may develop constipation because certain hormones are causing the muscles that push waste through your intestines to slow down.  You may develop hemorrhoids or swollen, bulging veins (varicose veins).  You may have back pain because of the weight gain and pregnancy hormones relaxing your joints between the bones in your pelvis and as a result of a shift in weight and the muscles that support your balance.  Your breasts will continue to grow and be tender.  Your gums may bleed and may be sensitive to brushing and flossing.  Dark spots or blotches (chloasma, mask of pregnancy) may develop on your face. This will likely fade after the baby is born.  A dark line from your belly button to the pubic area (linea nigra) may appear. This will likely  fade after the baby is born.  You may have changes in your hair. These can include thickening of your hair, rapid growth, and changes in texture. Some women also have hair loss during or after pregnancy, or hair that feels dry or thin. Your hair will most likely return to normal after your baby is born. WHAT TO EXPECT AT YOUR PRENATAL VISITS During a routine prenatal visit:  You will be weighed to make sure you and the fetus are growing normally.  Your blood pressure will be taken.  Your abdomen will be measured to track your baby's growth.  The fetal heartbeat will be listened to.  Any test results from the previous visit will be discussed. Your health care provider may ask you:  How you are feeling.  If you are feeling the baby move.  If you have had any abnormal symptoms, such as leaking fluid, bleeding, severe headaches, or abdominal cramping.  If you have any questions. Other tests that may be performed during your second trimester include:  Blood tests that check for:  Low iron levels (anemia).  Gestational diabetes (between 24 and 28 weeks).  Rh antibodies.  Urine tests to check for infections, diabetes, or protein in the urine.  An ultrasound to confirm the proper growth and development of the baby.  An amniocentesis to check for possible genetic problems.  Fetal screens for spina bifida and Down syndrome. HOME CARE INSTRUCTIONS   Avoid all smoking, herbs, alcohol, and unprescribed   drugs. These chemicals affect the formation and growth of the baby.  Follow your health care provider's instructions regarding medicine use. There are medicines that are either safe or unsafe to take during pregnancy.  Exercise only as directed by your health care provider. Experiencing uterine cramps is a good sign to stop exercising.  Continue to eat regular, healthy meals.  Wear a good support bra for breast tenderness.  Do not use hot tubs, steam rooms, or saunas.  Wear  your seat belt at all times when driving.  Avoid raw meat, uncooked cheese, cat litter boxes, and soil used by cats. These carry germs that can cause birth defects in the baby.  Take your prenatal vitamins.  Try taking a stool softener (if your health care provider approves) if you develop constipation. Eat more high-fiber foods, such as fresh vegetables or fruit and whole grains. Drink plenty of fluids to keep your urine clear or pale yellow.  Take warm sitz baths to soothe any pain or discomfort caused by hemorrhoids. Use hemorrhoid cream if your health care provider approves.  If you develop varicose veins, wear support hose. Elevate your feet for 15 minutes, 3-4 times a day. Limit salt in your diet.  Avoid heavy lifting, wear low heel shoes, and practice good posture.  Rest with your legs elevated if you have leg cramps or low back pain.  Visit your dentist if you have not gone yet during your pregnancy. Use a soft toothbrush to brush your teeth and be gentle when you floss.  A sexual relationship may be continued unless your health care provider directs you otherwise.  Continue to go to all your prenatal visits as directed by your health care provider. SEEK MEDICAL CARE IF:   You have dizziness.  You have mild pelvic cramps, pelvic pressure, or nagging pain in the abdominal area.  You have persistent nausea, vomiting, or diarrhea.  You have a bad smelling vaginal discharge.  You have pain with urination. SEEK IMMEDIATE MEDICAL CARE IF:   You have a fever.  You are leaking fluid from your vagina.  You have spotting or bleeding from your vagina.  You have severe abdominal cramping or pain.  You have rapid weight gain or loss.  You have shortness of breath with chest pain.  You notice sudden or extreme swelling of your face, hands, ankles, feet, or legs.  You have not felt your baby move in over an hour.  You have severe headaches that do not go away with  medicine.  You have vision changes. Document Released: 11/20/2001 Document Revised: 12/01/2013 Document Reviewed: 01/27/2013 [employer] Patient Information 2015 [employer]. This information is not intended to replace advice given to you by your health care provider. Make sure you discuss any questions you have with your health care provider.  Breastfeeding Deciding to breastfeed is one of the best choices you can make for you and your baby. A change in hormones during pregnancy causes your breast tissue to grow and increases the number and size of your milk ducts. These hormones also allow proteins, sugars, and fats from your blood supply to make breast milk in your milk-producing glands. Hormones prevent breast milk from being released before your baby is born as well as prompt milk flow after birth. Once breastfeeding has begun, thoughts of your baby, as well as his or her sucking or crying, can stimulate the release of milk from your milk-producing glands.  BENEFITS OF BREASTFEEDING For Your Baby  Your first   milk (colostrum) helps your baby's digestive system function better.   There are antibodies in your milk that help your baby fight off infections.   Your baby has a lower incidence of asthma, allergies, and sudden infant death syndrome.   The nutrients in breast milk are better for your baby than infant formulas and are designed uniquely for your baby's needs.   Breast milk improves your baby's brain development.   Your baby is less likely to develop other conditions, such as childhood obesity, asthma, or type 2 diabetes mellitus.  For You   Breastfeeding helps to create a very special bond between you and your baby.   Breastfeeding is convenient. Breast milk is always available at the correct temperature and costs nothing.   Breastfeeding helps to burn calories and helps you lose the weight gained during pregnancy.   Breastfeeding makes your uterus contract to its  prepregnancy size faster and slows bleeding (lochia) after you give birth.   Breastfeeding helps to lower your risk of developing type 2 diabetes mellitus, osteoporosis, and breast or ovarian cancer later in life. SIGNS THAT YOUR BABY IS HUNGRY Early Signs of Hunger  Increased alertness or activity.  Stretching.  Movement of the head from side to side.  Movement of the head and opening of the mouth when the corner of the mouth or cheek is stroked (rooting).  Increased sucking sounds, smacking lips, cooing, sighing, or squeaking.  Hand-to-mouth movements.  Increased sucking of fingers or hands. Late Signs of Hunger  Fussing.  Intermittent crying. Extreme Signs of Hunger Signs of extreme hunger will require calming and consoling before your baby will be able to breastfeed successfully. Do not wait for the following signs of extreme hunger to occur before you initiate breastfeeding:   Restlessness.  A loud, strong cry.   Screaming. BREASTFEEDING BASICS Breastfeeding Initiation  Find a comfortable place to sit or lie down, with your neck and back well supported.  Place a pillow or rolled up blanket under your baby to bring him or her to the level of your breast (if you are seated). Nursing pillows are specially designed to help support your arms and your baby while you breastfeed.  Make sure that your baby's abdomen is facing your abdomen.   Gently massage your breast. With your fingertips, massage from your chest wall toward your nipple in a circular motion. This encourages milk flow. You may need to continue this action during the feeding if your milk flows slowly.  Support your breast with 4 fingers underneath and your thumb above your nipple. Make sure your fingers are well away from your nipple and your baby's mouth.   Stroke your baby's lips gently with your finger or nipple.   When your baby's mouth is open wide enough, quickly bring your baby to your breast,  placing your entire nipple and as much of the colored area around your nipple (areola) as possible into your baby's mouth.   More areola should be visible above your baby's upper lip than below the lower lip.   Your baby's tongue should be between his or her lower gum and your breast.   Ensure that your baby's mouth is correctly positioned around your nipple (latched). Your baby's lips should create a seal on your breast and be turned out (everted).  It is common for your baby to suck about 2-3 minutes in order to start the flow of breast milk. Latching Teaching your baby how to latch on to your breast   properly is very important. An improper latch can cause nipple pain and decreased milk supply for you and poor weight gain in your baby. Also, if your baby is not latched onto your nipple properly, he or she may swallow some air during feeding. This can make your baby fussy. Burping your baby when you switch breasts during the feeding can help to get rid of the air. However, teaching your baby to latch on properly is still the best way to prevent fussiness from swallowing air while breastfeeding. Signs that your baby has successfully latched on to your nipple:    Silent tugging or silent sucking, without causing you pain.   Swallowing heard between every 3-4 sucks.    Muscle movement above and in front of his or her ears while sucking.  Signs that your baby has not successfully latched on to nipple:   Sucking sounds or smacking sounds from your baby while breastfeeding.  Nipple pain. If you think your baby has not latched on correctly, slip your finger into the corner of your baby's mouth to break the suction and place it between your baby's gums. Attempt breastfeeding initiation again. Signs of Successful Breastfeeding Signs from your baby:   A gradual decrease in the number of sucks or complete cessation of sucking.   Falling asleep.   Relaxation of his or her body.    Retention of a small amount of milk in his or her mouth.   Letting go of your breast by himself or herself. Signs from you:  Breasts that have increased in firmness, weight, and size 1-3 hours after feeding.   Breasts that are softer immediately after breastfeeding.  Increased milk volume, as well as a change in milk consistency and color by the fifth day of breastfeeding.   Nipples that are not sore, cracked, or bleeding. Signs That Your Baby is Getting Enough Milk  Wetting at least 3 diapers in a 24-hour period. The urine should be clear and pale yellow by age 5 days.  At least 3 stools in a 24-hour period by age 5 days. The stool should be soft and yellow.  At least 3 stools in a 24-hour period by age 7 days. The stool should be seedy and yellow.  No loss of weight greater than 10% of birth weight during the first 3 days of age.  Average weight gain of 4-7 ounces (113-198 g) per week after age 4 days.  Consistent daily weight gain by age 5 days, without weight loss after the age of 2 weeks. After a feeding, your baby may spit up a small amount. This is common. BREASTFEEDING FREQUENCY AND DURATION Frequent feeding will help you make more milk and can prevent sore nipples and breast engorgement. Breastfeed when you feel the need to reduce the fullness of your breasts or when your baby shows signs of hunger. This is called "breastfeeding on demand." Avoid introducing a pacifier to your baby while you are working to establish breastfeeding (the first 4-6 weeks after your baby is born). After this time you may choose to use a pacifier. Research has shown that pacifier use during the first year of a baby's life decreases the risk of sudden infant death syndrome (SIDS). Allow your baby to feed on each breast as long as he or she wants. Breastfeed until your baby is finished feeding. When your baby unlatches or falls asleep while feeding from the first breast, offer the second breast.  Because newborns are often sleepy in the   first few weeks of life, you may need to awaken your baby to get him or her to feed. Breastfeeding times will vary from baby to baby. However, the following rules can serve as a guide to help you ensure that your baby is properly fed:  Newborns (babies 4 weeks of age or younger) may breastfeed every 1-3 hours.  Newborns should not go longer than 3 hours during the day or 5 hours during the night without breastfeeding.  You should breastfeed your baby a minimum of 8 times in a 24-hour period until you begin to introduce solid foods to your baby at around 6 months of age. BREAST MILK PUMPING Pumping and storing breast milk allows you to ensure that your baby is exclusively fed your breast milk, even at times when you are unable to breastfeed. This is especially important if you are going back to work while you are still breastfeeding or when you are not able to be present during feedings. Your lactation consultant can give you guidelines on how long it is safe to store breast milk.  A breast pump is a machine that allows you to pump milk from your breast into a sterile bottle. The pumped breast milk can then be stored in a refrigerator or freezer. Some breast pumps are operated by hand, while others use electricity. Ask your lactation consultant which type will work best for you. Breast pumps can be purchased, but some hospitals and breastfeeding support groups lease breast pumps on a monthly basis. A lactation consultant can teach you how to hand express breast milk, if you prefer not to use a pump.  CARING FOR YOUR BREASTS WHILE YOU BREASTFEED Nipples can become dry, cracked, and sore while breastfeeding. The following recommendations can help keep your breasts moisturized and healthy:  Avoid using soap on your nipples.   Wear a supportive bra. Although not required, special nursing bras and tank tops are designed to allow access to your breasts for  breastfeeding without taking off your entire bra or top. Avoid wearing underwire-style bras or extremely tight bras.  Air dry your nipples for 3-4minutes after each feeding.   Use only cotton bra pads to absorb leaked breast milk. Leaking of breast milk between feedings is normal.   Use lanolin on your nipples after breastfeeding. Lanolin helps to maintain your skin's normal moisture barrier. If you use pure lanolin, you do not need to wash it off before feeding your baby again. Pure lanolin is not toxic to your baby. You may also hand express a few drops of breast milk and gently massage that milk into your nipples and allow the milk to air dry. In the first few weeks after giving birth, some women experience extremely full breasts (engorgement). Engorgement can make your breasts feel heavy, warm, and tender to the touch. Engorgement peaks within 3-5 days after you give birth. The following recommendations can help ease engorgement:  Completely empty your breasts while breastfeeding or pumping. You may want to start by applying warm, moist heat (in the shower or with warm water-soaked hand towels) just before feeding or pumping. This increases circulation and helps the milk flow. If your baby does not completely empty your breasts while breastfeeding, pump any extra milk after he or she is finished.  Wear a snug bra (nursing or regular) or tank top for 1-2 days to signal your body to slightly decrease milk production.  Apply ice packs to your breasts, unless this is too uncomfortable for you.    Make sure that your baby is latched on and positioned properly while breastfeeding. If engorgement persists after 48 hours of following these recommendations, contact your health care provider or a lactation consultant. OVERALL HEALTH CARE RECOMMENDATIONS WHILE BREASTFEEDING  Eat healthy foods. Alternate between meals and snacks, eating 3 of each per day. Because what you eat affects your breast milk,  some of the foods may make your baby more irritable than usual. Avoid eating these foods if you are sure that they are negatively affecting your baby.  Drink milk, fruit juice, and water to satisfy your thirst (about 10 glasses a day).   Rest often, relax, and continue to take your prenatal vitamins to prevent fatigue, stress, and anemia.  Continue breast self-awareness checks.  Avoid chewing and smoking tobacco.  Avoid alcohol and drug use. Some medicines that may be harmful to your baby can pass through breast milk. It is important to ask your health care provider before taking any medicine, including all over-the-counter and prescription medicine as well as vitamin and herbal supplements. It is possible to become pregnant while breastfeeding. If birth control is desired, ask your health care provider about options that will be safe for your baby. SEEK MEDICAL CARE IF:   You feel like you want to stop breastfeeding or have become frustrated with breastfeeding.  You have painful breasts or nipples.  Your nipples are cracked or bleeding.  Your breasts are red, tender, or warm.  You have a swollen area on either breast.  You have a fever or chills.  You have nausea or vomiting.  You have drainage other than breast milk from your nipples.  Your breasts do not become full before feedings by the fifth day after you give birth.  You feel sad and depressed.  Your baby is too sleepy to eat well.  Your baby is having trouble sleeping.   Your baby is wetting less than 3 diapers in a 24-hour period.  Your baby has less than 3 stools in a 24-hour period.  Your baby's skin or the white part of his or her eyes becomes yellow.   Your baby is not gaining weight by 5 days of age. SEEK IMMEDIATE MEDICAL CARE IF:   Your baby is overly tired (lethargic) and does not want to wake up and feed.  Your baby develops an unexplained fever. Document Released: 11/26/2005 Document Revised:  12/01/2013 Document Reviewed: 05/20/2013 [employer] Patient Information 2015 [employer]. This information is not intended to replace advice given to you by your health care provider. Make sure you discuss any questions you have with your health care provider.  

## 2015-03-09 NOTE — Progress Notes (Signed)
Pt has not had u/s but has certain lmp.

## 2015-03-09 NOTE — Progress Notes (Signed)
   Subjective:    [name] is a G1P0 813w1d being seen today for her first obstetrical visit.  Her obstetrical history is significant for nothing. Is having morning sickness. Has gained 1 lb. . Patient does intend to breast feed. Pregnancy history fully reviewed.  Patient reports nausea, no bleeding, no cramping and vomiting.  Filed Vitals:   03/09/15 0949  BP: 119/73  Pulse: 74  Weight: 169 lb 11.2 oz (76.975 kg)    HISTORY: OB History  Gravida Para Term Preterm AB SAB TAB Ectopic Multiple Living  1             # Outcome Date GA Lbr Len/2nd Weight Sex Delivery Anes PTL Lv  1 Current              Past Medical History  Diagnosis Date  . UTI (lower urinary tract infection)     recurrent  . Herniated disc   . Medical history non-contributory    Past Surgical History  Procedure Laterality Date  . No past surgeries     Family History  Problem Relation Age of Onset  . Hypertension Mother   . Hypertension Maternal Grandmother   . Hypertension Maternal Grandfather      Exam    Uterus:    U/2   Pelvic Exam:    Perineum: No Hemorrhoids, Normal Perineum   Vulva: normal   Vagina:  normal mucosa, moderate amount of thin, white, malodorous discharge    Cervix: no bleeding following Pap, no cervical motion tenderness and nulliparous appearance   Adnexa: normal adnexa and no mass, fullness, tenderness   Bony Pelvis: average  System: Breast:  normal appearance, no masses or tenderness, Bilateral nipple piercings present. No tenderness, erythema or discharge.   Skin: normal coloration and turgor, no rashes    Neurologic: normal mood, grossly non-focal   Extremities: normal strength, tone, and muscle mass   HEENT sclera clear, anicteric and thyroid without masses   Mouth/Teeth mucous membranes moist, pharynx normal without lesions and dental hygiene good   Neck no masses   Cardiovascular: regular rate and rhythm, no murmurs or gallops   Respiratory:  appears well,  vitals normal, no respiratory distress, acyanotic, normal RR, chest clear, no wheezing, crepitations, rhonchi, normal symmetric air entry   Abdomen: soft, non-tender; bowel sounds normal; no masses,  no organomegaly   Urinary: urethral meatus normal     Assessment:   1. Supervision of normal pregnancy in second trimester  - Flu Vaccine QUAD 36+ mos IM; Standing - Prenatal Profile - Prescript Monitor Profile(19) - Culture, OB Urine - Flu Vaccine QUAD 36+ mos IM - US OB Comp + 14 Wk; Future - Cytology - PAP  2. Nausea/vomiting in pregnancy  - ondansetron (ZOFRAN ODT) 8 MG disintegrating tablet; Take 1 tablet (8 mg total) by mouth every 8 (eight) hours as needed for nausea or vomiting.  Dispense: 30 tablet; Refill: 2  3. Vaginal discharge during pregnancy in second trimester  - Cytology - PAP    Plan:     Initial labs drawn. Prenatal vitamins. Problem list reviewed and updated. Genetic Screening discussed Quad Screen: requested. Ultrasound discussed; fetal survey: ordered. Follow up in 4 weeks.  Evelyn KinsmanSMITH, [name] 03/09/2015

## 2015-03-10 LAB — PRENATAL PROFILE (SOLSTAS)
ANTIBODY SCREEN: NEGATIVE
Basophils Absolute: 0 10*3/uL (ref 0.0–0.1)
Basophils Relative: 0 % (ref 0–1)
EOS ABS: 0.2 10*3/uL (ref 0.0–0.7)
EOS PCT: 1 % (ref 0–5)
HCT: 35.5 % — ABNORMAL LOW (ref 36.0–46.0)
HIV 1&2 Ab, 4th Generation: NONREACTIVE
Hemoglobin: 11.9 g/dL — ABNORMAL LOW (ref 12.0–15.0)
Hepatitis B Surface Ag: NEGATIVE
Lymphocytes Relative: 15 % (ref 12–46)
Lymphs Abs: 2.5 10*3/uL (ref 0.7–4.0)
MCH: 30.4 pg (ref 26.0–34.0)
MCHC: 33.5 g/dL (ref 30.0–36.0)
MCV: 90.6 fL (ref 78.0–100.0)
MONO ABS: 1 10*3/uL (ref 0.1–1.0)
MONOS PCT: 6 % (ref 3–12)
MPV: 8.6 fL (ref 8.6–12.4)
NEUTROS ABS: 13.2 10*3/uL — AB (ref 1.7–7.7)
NEUTROS PCT: 78 % — AB (ref 43–77)
Platelets: 330 10*3/uL (ref 150–400)
RBC: 3.92 MIL/uL (ref 3.87–5.11)
RDW: 12.9 % (ref 11.5–15.5)
RUBELLA: 1.17 {index} — AB (ref <0.90)
Rh Type: POSITIVE
WBC: 16.9 10*3/uL — ABNORMAL HIGH (ref 4.0–10.5)

## 2015-03-10 LAB — CULTURE, OB URINE
COLONY COUNT: NO GROWTH
ORGANISM ID, BACTERIA: NO GROWTH

## 2015-03-11 LAB — CERVICOVAGINAL ANCILLARY ONLY: Candida vaginitis: NEGATIVE

## 2015-03-11 LAB — CYTOLOGY - PAP

## 2015-03-12 ENCOUNTER — Other Ambulatory Visit: Payer: Self-pay | Admitting: Advanced Practice Midwife

## 2015-03-12 DIAGNOSIS — N760 Acute vaginitis: Principal | ICD-10-CM

## 2015-03-12 DIAGNOSIS — B3731 Acute candidiasis of vulva and vagina: Secondary | ICD-10-CM

## 2015-03-12 DIAGNOSIS — B373 Candidiasis of vulva and vagina: Secondary | ICD-10-CM

## 2015-03-12 DIAGNOSIS — B9689 Other specified bacterial agents as the cause of diseases classified elsewhere: Secondary | ICD-10-CM

## 2015-03-12 MED ORDER — METRONIDAZOLE 500 MG PO TABS: 500.0000 mg | ORAL_TABLET | Freq: Two times a day (BID) | ORAL | Status: DC

## 2015-03-12 MED ORDER — TERCONAZOLE 0.4 % VA CREA: 1.0000 | TOPICAL_CREAM | Freq: Every day | VAGINAL | Status: AC

## 2015-03-12 NOTE — Progress Notes (Signed)
BV and yeast infection. Rx Flagyl and Terazol. Please notify pt.

## 2015-03-13 LAB — CANNABANOIDS (GC/LC/MS), URINE: THC-COOH UR CONFIRM: 583 ng/mL — AB (ref <5)

## 2015-03-14 NOTE — Progress Notes (Signed)
Patient returned call. Called patient. No answer. Left message stating we are returning your call, please call clinic and leave message stating whether or not it is OK to leave results over voicemail.

## 2015-03-14 NOTE — Progress Notes (Signed)
Attempted to contact patient to inform of RX. Mobile number unavailable. Left message on home phone stating we are calling with results, please call clinic.

## 2015-03-14 NOTE — Progress Notes (Addendum)
Patient returned call and stated a detailed message could be left on her voicemail. Called patient and spoke with her-- informed her of results and RX at pharmacy. Patient verbalized understanding and gratitude. No questions or concerns.

## 2015-03-15 LAB — PRESCRIPTION MONITORING PROFILE (19 PANEL)
AMPHETAMINE/METH: NEGATIVE ng/mL
BARBITURATE SCREEN, URINE: NEGATIVE ng/mL
BENZODIAZEPINE SCREEN, URINE: NEGATIVE ng/mL
Buprenorphine, Urine: NEGATIVE ng/mL
CARISOPRODOL, URINE: NEGATIVE ng/mL
CREATININE, URINE: 62.32 mg/dL (ref >20.0)
Cocaine Metabolites: NEGATIVE ng/mL
Fentanyl, Ur: NEGATIVE ng/mL
MDMA URINE: NEGATIVE ng/mL
MEPERIDINE UR: NEGATIVE ng/mL
METHAQUALONE SCREEN (URINE): NEGATIVE ng/mL
Methadone Screen, Urine: NEGATIVE ng/mL
NITRITES URINE, INITIAL: NEGATIVE ug/mL
OXYCODONE SCRN UR: NEGATIVE ng/mL
Opiate Screen, Urine: NEGATIVE ng/mL
PH URINE, INITIAL: 6.8 pH (ref 4.5–8.9)
PROPOXYPHENE: NEGATIVE ng/mL
Phencyclidine, Ur: NEGATIVE ng/mL
TAPENTADOLUR: NEGATIVE ng/mL
TRAMADOL UR: NEGATIVE ng/mL
Zolpidem, Urine: NEGATIVE ng/mL

## 2015-04-06 ENCOUNTER — Telehealth: Payer: Self-pay | Admitting: Advanced Practice Midwife

## 2015-04-06 ENCOUNTER — Encounter: Payer: BC Managed Care – PPO | Admitting: Family

## 2015-04-06 ENCOUNTER — Encounter: Payer: Self-pay | Admitting: Family

## 2015-04-06 NOTE — Telephone Encounter (Signed)
Called left message for patient to return call to clinics. Mailing no show letter.

## 2015-04-08 ENCOUNTER — Ambulatory Visit (HOSPITAL_COMMUNITY): Payer: MEDICAID

## 2015-04-29 ENCOUNTER — Encounter: Payer: Self-pay | Admitting: General Practice

## 2015-05-27 ENCOUNTER — Inpatient Hospital Stay (HOSPITAL_COMMUNITY)
Admission: AD | Admit: 2015-05-27 | Discharge: 2015-05-27 | Disposition: A | Discharge status: Home / Self Care | Payer: BC Managed Care – PPO | Source: Ambulatory Visit | Attending: Obstetrics | Admitting: Obstetrics

## 2015-05-27 ENCOUNTER — Encounter (HOSPITAL_COMMUNITY): Payer: Self-pay | Admitting: *Deleted

## 2015-05-27 DIAGNOSIS — Z3A26 26 weeks gestation of pregnancy: Secondary | ICD-10-CM | POA: Diagnosis not present

## 2015-05-27 DIAGNOSIS — R109 Unspecified abdominal pain: Secondary | ICD-10-CM

## 2015-05-27 DIAGNOSIS — O9989 Other specified diseases and conditions complicating pregnancy, childbirth and the puerperium: Secondary | ICD-10-CM | POA: Diagnosis not present

## 2015-05-27 DIAGNOSIS — Z87891 Personal history of nicotine dependence: Secondary | ICD-10-CM | POA: Diagnosis not present

## 2015-05-27 DIAGNOSIS — O26899 Other specified pregnancy related conditions, unspecified trimester: Secondary | ICD-10-CM

## 2015-05-27 LAB — URINALYSIS, ROUTINE W REFLEX MICROSCOPIC
Bilirubin Urine: NEGATIVE
Glucose, UA: NEGATIVE mg/dL
HGB URINE DIPSTICK: NEGATIVE
Ketones, ur: NEGATIVE mg/dL
Leukocytes, UA: NEGATIVE
Nitrite: NEGATIVE
PH: 6.5 (ref 5.0–8.0)
PROTEIN: NEGATIVE mg/dL
SPECIFIC GRAVITY, URINE: 1.01 (ref 1.005–1.030)
Urobilinogen, UA: 0.2 mg/dL (ref 0.0–1.0)

## 2015-05-27 NOTE — MAU Note (Signed)
Severe pain in lower abdomen that started today around 1650 lasting 5 minutes at a time 2 different times. Denies vaginal bleeding or discharge. Has not had sex in several weeks.

## 2015-05-27 NOTE — MAU Provider Note (Signed)
History     CSN: [id number]  Arrival date and time: 05/27/15 1752   First Provider Initiated Contact with Patient 05/27/15 1826      Chief Complaint  Patient presents with  . Abdominal Cramping   Abdominal Cramping This is a new problem. The current episode started today. The onset quality is sudden. The problem occurs rarely. The pain is located in the generalized abdominal region. The pain is mild. The quality of the pain is cramping. The abdominal pain does not radiate.    24 y.o. G1P0 @ [redacted]w[redacted]d presents to the MAU stating that she hada contraction at work and wanted to make sure everything was ok. She denies vaginal bleeding, LOF and reports positive fetal movmenet.  Past Medical History  Diagnosis Date  . UTI (lower urinary tract infection)     recurrent  . Herniated disc   . Medical history non-contributory     Past Surgical History  Procedure Laterality Date  . No past surgeries      Family History  Problem Relation Age of Onset  . Hypertension Mother   . Hypertension Maternal Grandmother   . Hypertension Maternal Grandfather     History  Substance Use Topics  . Smoking status: Former Smoker    Quit date: 11/09/2014  . Smokeless tobacco: Never Used  . Alcohol Use: Yes     Comment: not while pregnant    Allergies: No Known Allergies  Prescriptions prior to admission  Medication Sig Dispense Refill Last Dose  . Doxylamine Succinate, Sleep, (UNISOM PO) Take 1 tablet by mouth at bedtime. Takes 1 Unisom along with Vitamin B6 at bedtime   Not Taking  . Doxylamine-Pyridoxine 10-10 MG TBEC 2 PO qhs; may take 1po in am and 1po in afternoon prn nausea (Patient not taking: Reported on 03/09/2015) 120 tablet 3 Not Taking  . famotidine (PEPCID) 20 MG tablet Take 1 tablet (20 mg total) by mouth 2 (two) times daily. (Patient not taking: Reported on 03/09/2015) 60 tablet 0 Not Taking  . metroNIDAZOLE (FLAGYL) 500 MG tablet Take 1 tablet (500 mg total) by mouth 2 (two) times  daily. 14 tablet 0   . ondansetron (ZOFRAN ODT) 8 MG disintegrating tablet Take 1 tablet (8 mg total) by mouth every 8 (eight) hours as needed for nausea or vomiting. 30 tablet 2   . Prenatal Vit-Min-FA-Fish Oil ([employer] PRENATAL GUMMY PO) Take 2 tablets by mouth daily.   Taking  . promethazine (PHENERGAN) 12.5 MG tablet Take 1 tablet (12.5 mg total) by mouth every 6 (six) hours as needed for nausea or vomiting. (Patient not taking: Reported on 02/11/2015) 30 tablet 0 Not Taking  . pyridOXINE (VITAMIN B-6) 100 MG tablet Take 100 mg by mouth at bedtime. Takes 1 B6 tablet along with Unisom at bedtime   Not Taking    Review of Systems  Gastrointestinal: Positive for abdominal pain.  All other systems reviewed and are negative.  Physical Exam   Blood pressure 122/74, pulse 87, temperature 97.7 F (36.5 C), temperature source Oral, resp. rate 18, last menstrual period 11/23/2014.  Physical Exam  Nursing note and vitals reviewed. Constitutional: She is oriented to person, place, and time. She appears well-developed and well-nourished. No distress.  HENT:  Head: Normocephalic.  Neck: Normal range of motion.  Cardiovascular: Normal rate.   Respiratory: Effort normal. No respiratory distress.  GI: Soft.  Musculoskeletal: Normal range of motion. She exhibits no edema.  Neurological: She is alert and oriented to person, place, and  time.  Skin: Skin is warm and dry.  Psychiatric: She has a normal mood and affect. Her behavior is normal. Judgment and thought content normal.    MAU Course  Procedures  MDM EFM- Reactive fht's no contractions; spoke to Dr Clearance Coots- Pt will be discharged to home.  Assessment and Plan  Abdominal pain in Pregnancy  Discharge to home.  Clemmons,Lori Grissett 05/27/2015, 7:48 PM

## 2015-05-27 NOTE — Discharge Instructions (Signed)

## 2015-07-26 LAB — OB RESULTS CONSOLE GBS: STREP GROUP B AG: POSITIVE

## 2015-08-30 ENCOUNTER — Inpatient Hospital Stay (HOSPITAL_COMMUNITY)
Admission: AD | Admit: 2015-08-30 | Discharge: 2015-08-30 | Disposition: A | Discharge status: Home / Self Care | Payer: BC Managed Care – PPO | Source: Ambulatory Visit | Attending: Obstetrics | Admitting: Obstetrics

## 2015-08-30 DIAGNOSIS — Z3493 Encounter for supervision of normal pregnancy, unspecified, third trimester: Secondary | ICD-10-CM | POA: Diagnosis present

## 2015-08-30 LAB — AMNISURE RUPTURE OF MEMBRANE (ROM) NOT AT ARMC: Amnisure ROM: NEGATIVE

## 2015-08-30 NOTE — Discharge Instructions (Signed)
Fetal Movement Counts °Patient Name: __________________________________________________ Patient Due Date: ____________________ °Performing a fetal movement count is highly recommended in high-risk pregnancies, but it is good for every pregnant woman to do. Your health care provider may ask you to start counting fetal movements at 28 weeks of the pregnancy. Fetal movements often increase: °· After eating a full meal. °· After physical activity. °· After eating or drinking something sweet or cold. °· At rest. °Pay attention to when you feel the baby is most active. This will help you notice a pattern of your baby's sleep and wake cycles and what factors contribute to an increase in fetal movement. It is important to perform a fetal movement count at the same time each day when your baby is normally most active.  °HOW TO COUNT FETAL MOVEMENTS °1. Find a quiet and comfortable area to sit or lie down on your left side. Lying on your left side provides the best blood and oxygen circulation to your baby. °2. Write down the day and time on a sheet of paper or in a journal. °3. Start counting kicks, flutters, swishes, rolls, or jabs in a 2-hour period. You should feel at least 10 movements within 2 hours. °4. If you do not feel 10 movements in 2 hours, wait 2-3 hours and count again. Look for a change in the pattern or not enough counts in 2 hours. °SEEK MEDICAL CARE IF: °· You feel less than 10 counts in 2 hours, tried twice. °· There is no movement in over an hour. °· The pattern is changing or taking longer each day to reach 10 counts in 2 hours. °· You feel the baby is not moving as he or she usually does. °Date: ____________ Movements: ____________ Start time: ____________ Finish time: ____________  °Date: ____________ Movements: ____________ Start time: ____________ Finish time: ____________ °Date: ____________ Movements: ____________ Start time: ____________ Finish time: ____________ °Date: ____________ Movements:  ____________ Start time: ____________ Finish time: ____________ °Date: ____________ Movements: ____________ Start time: ____________ Finish time: ____________ °Date: ____________ Movements: ____________ Start time: ____________ Finish time: ____________ °Date: ____________ Movements: ____________ Start time: ____________ Finish time: ____________ °Date: ____________ Movements: ____________ Start time: ____________ Finish time: ____________  °Date: ____________ Movements: ____________ Start time: ____________ Finish time: ____________ °Date: ____________ Movements: ____________ Start time: ____________ Finish time: ____________ °Date: ____________ Movements: ____________ Start time: ____________ Finish time: ____________ °Date: ____________ Movements: ____________ Start time: ____________ Finish time: ____________ °Date: ____________ Movements: ____________ Start time: ____________ Finish time: ____________ °Date: ____________ Movements: ____________ Start time: ____________ Finish time: ____________ °Date: ____________ Movements: ____________ Start time: ____________ Finish time: ____________  °Date: ____________ Movements: ____________ Start time: ____________ Finish time: ____________ °Date: ____________ Movements: ____________ Start time: ____________ Finish time: ____________ °Date: ____________ Movements: ____________ Start time: ____________ Finish time: ____________ °Date: ____________ Movements: ____________ Start time: ____________ Finish time: ____________ °Date: ____________ Movements: ____________ Start time: ____________ Finish time: ____________ °Date: ____________ Movements: ____________ Start time: ____________ Finish time: ____________ °Date: ____________ Movements: ____________ Start time: ____________ Finish time: ____________  °Date: ____________ Movements: ____________ Start time: ____________ Finish time: ____________ °Date: ____________ Movements: ____________ Start time: ____________ Finish  time: ____________ °Date: ____________ Movements: ____________ Start time: ____________ Finish time: ____________ °Date: ____________ Movements: ____________ Start time: ____________ Finish time: ____________ °Date: ____________ Movements: ____________ Start time: ____________ Finish time: ____________ °Date: ____________ Movements: ____________ Start time: ____________ Finish time: ____________ °Date: ____________ Movements: ____________ Start time: ____________ Finish time: ____________  °Date: ____________ Movements: ____________ Start time: ____________ Finish   time: ____________ °Date: ____________ Movements: ____________ Start time: ____________ Finish time: ____________ °Date: ____________ Movements: ____________ Start time: ____________ Finish time: ____________ °Date: ____________ Movements: ____________ Start time: ____________ Finish time: ____________ °Date: ____________ Movements: ____________ Start time: ____________ Finish time: ____________ °Date: ____________ Movements: ____________ Start time: ____________ Finish time: ____________ °Date: ____________ Movements: ____________ Start time: ____________ Finish time: ____________  °Date: ____________ Movements: ____________ Start time: ____________ Finish time: ____________ °Date: ____________ Movements: ____________ Start time: ____________ Finish time: ____________ °Date: ____________ Movements: ____________ Start time: ____________ Finish time: ____________ °Date: ____________ Movements: ____________ Start time: ____________ Finish time: ____________ °Date: ____________ Movements: ____________ Start time: ____________ Finish time: ____________ °Date: ____________ Movements: ____________ Start time: ____________ Finish time: ____________ °Date: ____________ Movements: ____________ Start time: ____________ Finish time: ____________  °Date: ____________ Movements: ____________ Start time: ____________ Finish time: ____________ °Date: ____________  Movements: ____________ Start time: ____________ Finish time: ____________ °Date: ____________ Movements: ____________ Start time: ____________ Finish time: ____________ °Date: ____________ Movements: ____________ Start time: ____________ Finish time: ____________ °Date: ____________ Movements: ____________ Start time: ____________ Finish time: ____________ °Date: ____________ Movements: ____________ Start time: ____________ Finish time: ____________ °Date: ____________ Movements: ____________ Start time: ____________ Finish time: ____________  °Date: ____________ Movements: ____________ Start time: ____________ Finish time: ____________ °Date: ____________ Movements: ____________ Start time: ____________ Finish time: ____________ °Date: ____________ Movements: ____________ Start time: ____________ Finish time: ____________ °Date: ____________ Movements: ____________ Start time: ____________ Finish time: ____________ °Date: ____________ Movements: ____________ Start time: ____________ Finish time: ____________ °Date: ____________ Movements: ____________ Start time: ____________ Finish time: ____________ °Document Released: 12/26/2006 Document Revised: 04/12/2014 Document Reviewed: 09/22/2012 °[employer]® Patient Information ©2015 [employer]. This information is not intended to replace advice given to you by your health care provider. Make sure you discuss any questions you have with your health care provider. °Braxton Hicks Contractions °Contractions of the uterus can occur throughout pregnancy. Contractions are not always a sign that you are in labor.  °WHAT ARE BRAXTON HICKS CONTRACTIONS?  °Contractions that occur before labor are called Braxton Hicks contractions, or false labor. Toward the end of pregnancy (32-34 weeks), these contractions can develop more often and may become more forceful. This is not true labor because these contractions do not result in opening (dilatation) and thinning of the cervix. They  are sometimes difficult to tell apart from true labor because these contractions can be forceful and people have different pain tolerances. You should not feel embarrassed if you go to the hospital with false labor. Sometimes, the only way to tell if you are in true labor is for your health care provider to look for changes in the cervix. °If there are no prenatal problems or other health problems associated with the pregnancy, it is completely safe to be sent home with false labor and await the onset of true labor. °HOW CAN YOU TELL THE DIFFERENCE BETWEEN TRUE AND FALSE LABOR? °False Labor °· The contractions of false labor are usually shorter and not as hard as those of true labor.   °· The contractions are usually irregular.   °· The contractions are often felt in the front of the lower abdomen and in the groin.   °· The contractions may go away when you walk around or change positions while lying down.   °· The contractions get weaker and are shorter lasting as time goes on.   °· The contractions do not usually become progressively stronger, regular, and closer together as with true labor.   °True Labor °5. Contractions in true labor last 30-70 seconds, become   very regular, usually become more intense, and increase in frequency.   °6. The contractions do not go away with walking.   °7. The discomfort is usually felt in the top of the uterus and spreads to the lower abdomen and low back.   °8. True labor can be determined by your health care provider with an exam. This will show that the cervix is dilating and getting thinner.   °WHAT TO REMEMBER °· Keep up with your usual exercises and follow other instructions given by your health care provider.   °· Take medicines as directed by your health care provider.   °· Keep your regular prenatal appointments.   °· Eat and drink lightly if you think you are going into labor.   °· If Braxton Hicks contractions are making you uncomfortable:   °· Change your position from  lying down or resting to walking, or from walking to resting.   °· Sit and rest in a tub of warm water.   °· Drink 2-3 glasses of water. Dehydration may cause these contractions.   °· Do slow and deep breathing several times an hour.   °WHEN SHOULD I SEEK IMMEDIATE MEDICAL CARE? °Seek immediate medical care if: °· Your contractions become stronger, more regular, and closer together.   °· You have fluid leaking or gushing from your vagina.   °· You have a fever.   °· You pass blood-tinged mucus.   °· You have vaginal bleeding.   °· You have continuous abdominal pain.   °· You have low back pain that you never had before.   °· You feel your baby's head pushing down and causing pelvic pressure.   °· Your baby is not moving as much as it used to.   °Document Released: 11/26/2005 Document Revised: 12/01/2013 Document Reviewed: 09/07/2013 °ExitCare® Patient Information ©2015 [employer]. This information is not intended to replace advice given to you by your health care provider. Make sure you discuss any questions you have with your health care provider. ° °

## 2015-09-01 ENCOUNTER — Inpatient Hospital Stay (HOSPITAL_COMMUNITY): Payer: BC Managed Care – PPO | Admitting: Anesthesiology

## 2015-09-01 ENCOUNTER — Encounter (HOSPITAL_COMMUNITY): Payer: Self-pay

## 2015-09-01 ENCOUNTER — Inpatient Hospital Stay (HOSPITAL_COMMUNITY)
Admission: AD | Admit: 2015-09-01 | Discharge: 2015-09-03 | DRG: 775 | Disposition: A | Discharge status: Home / Self Care | Payer: BC Managed Care – PPO | Source: Ambulatory Visit | Attending: Obstetrics | Admitting: Obstetrics

## 2015-09-01 DIAGNOSIS — O99824 Streptococcus B carrier state complicating childbirth: Secondary | ICD-10-CM | POA: Diagnosis present

## 2015-09-01 DIAGNOSIS — Z87891 Personal history of nicotine dependence: Secondary | ICD-10-CM | POA: Diagnosis not present

## 2015-09-01 DIAGNOSIS — Z3A00 Weeks of gestation of pregnancy not specified: Secondary | ICD-10-CM

## 2015-09-01 DIAGNOSIS — O480 Post-term pregnancy: Secondary | ICD-10-CM | POA: Diagnosis present

## 2015-09-01 LAB — TYPE AND SCREEN
ABO/RH(D): O POS
ANTIBODY SCREEN: NEGATIVE

## 2015-09-01 LAB — CBC
HCT: 33.9 % — ABNORMAL LOW (ref 36.0–46.0)
HEMOGLOBIN: 11.3 g/dL — AB (ref 12.0–15.0)
MCH: 29.7 pg (ref 26.0–34.0)
MCHC: 33.3 g/dL (ref 30.0–36.0)
MCV: 89.2 fL (ref 78.0–100.0)
Platelets: 315 10*3/uL (ref 150–400)
RBC: 3.8 MIL/uL — AB (ref 3.87–5.11)
RDW: 13.3 % (ref 11.5–15.5)
WBC: 17.6 10*3/uL — AB (ref 4.0–10.5)

## 2015-09-01 LAB — ABO/RH: ABO/RH(D): O POS

## 2015-09-01 LAB — RPR: RPR: NONREACTIVE

## 2015-09-01 MED ORDER — BENZOCAINE-MENTHOL 20-0.5 % EX AERO
1.0000 | INHALATION_SPRAY | CUTANEOUS | Status: DC | PRN
Start: 2015-09-01 — End: 2015-09-03
  Administered 2015-09-01: 1 via TOPICAL
  Filled 2015-09-01: qty 56

## 2015-09-01 MED ORDER — DIPHENHYDRAMINE HCL 25 MG PO CAPS: 25.0000 mg | ORAL_CAPSULE | Freq: Four times a day (QID) | ORAL | Status: DC | PRN

## 2015-09-01 MED ORDER — PHENYLEPHRINE 40 MCG/ML (10ML) SYRINGE FOR IV PUSH (FOR BLOOD PRESSURE SUPPORT)
80.0000 ug | PREFILLED_SYRINGE | INTRAVENOUS | Status: DC | PRN
  Filled 2015-09-01: qty 2
  Filled 2015-09-01: qty 20

## 2015-09-01 MED ORDER — OXYTOCIN BOLUS FROM INFUSION: 500.0000 mL | INTRAVENOUS | Status: DC

## 2015-09-01 MED ORDER — OXYCODONE-ACETAMINOPHEN 5-325 MG PO TABS: 2.0000 | ORAL_TABLET | ORAL | Status: DC | PRN

## 2015-09-01 MED ORDER — ONDANSETRON HCL 4 MG/2ML IJ SOLN
4.0000 mg | Freq: Four times a day (QID) | INTRAMUSCULAR | Status: DC | PRN
  Administered 2015-09-01: 4 mg via INTRAVENOUS
  Filled 2015-09-01: qty 2

## 2015-09-01 MED ORDER — INFLUENZA VAC SPLIT QUAD 0.5 ML IM SUSY
0.5000 mL | PREFILLED_SYRINGE | INTRAMUSCULAR | Status: AC
  Administered 2015-09-02: 0.5 mL via INTRAMUSCULAR

## 2015-09-01 MED ORDER — IBUPROFEN 600 MG PO TABS
600.0000 mg | ORAL_TABLET | Freq: Four times a day (QID) | ORAL | Status: DC
  Administered 2015-09-01 – 2015-09-03 (×8): 600 mg via ORAL
  Filled 2015-09-01 (×7): qty 1

## 2015-09-01 MED ORDER — PENICILLIN G POTASSIUM 5000000 UNITS IJ SOLR
5.0000 10*6.[IU] | Freq: Once | INTRAVENOUS | Status: AC
  Administered 2015-09-01: 5 10*6.[IU] via INTRAVENOUS
  Filled 2015-09-01: qty 5

## 2015-09-01 MED ORDER — DIBUCAINE 1 % RE OINT: 1.0000 "application " | TOPICAL_OINTMENT | RECTAL | Status: DC | PRN

## 2015-09-01 MED ORDER — LACTATED RINGERS IV SOLN: 500.0000 mL | INTRAVENOUS | Status: DC | PRN

## 2015-09-01 MED ORDER — PRENATAL MULTIVITAMIN CH
1.0000 | ORAL_TABLET | Freq: Every day | ORAL | Status: DC
  Administered 2015-09-02: 1 via ORAL
  Filled 2015-09-01: qty 1

## 2015-09-01 MED ORDER — PROMETHAZINE HCL 25 MG/ML IJ SOLN
25.0000 mg | Freq: Four times a day (QID) | INTRAMUSCULAR | Status: DC | PRN
  Administered 2015-09-01: 25 mg via INTRAVENOUS
  Filled 2015-09-01: qty 1

## 2015-09-01 MED ORDER — FLEET ENEMA 7-19 GM/118ML RE ENEM: 1.0000 | ENEMA | RECTAL | Status: DC | PRN

## 2015-09-01 MED ORDER — ONDANSETRON HCL 4 MG/2ML IJ SOLN: 4.0000 mg | INTRAMUSCULAR | Status: DC | PRN

## 2015-09-01 MED ORDER — OXYCODONE-ACETAMINOPHEN 5-325 MG PO TABS: 1.0000 | ORAL_TABLET | ORAL | Status: DC | PRN

## 2015-09-01 MED ORDER — ZOLPIDEM TARTRATE 5 MG PO TABS: 5.0000 mg | ORAL_TABLET | Freq: Every evening | ORAL | Status: DC | PRN

## 2015-09-01 MED ORDER — LACTATED RINGERS IV SOLN
INTRAVENOUS | Status: DC
  Administered 2015-09-01: 02:00:00 via INTRAVENOUS

## 2015-09-01 MED ORDER — SIMETHICONE 80 MG PO CHEW: 80.0000 mg | CHEWABLE_TABLET | ORAL | Status: DC | PRN

## 2015-09-01 MED ORDER — ACETAMINOPHEN 325 MG PO TABS: 650.0000 mg | ORAL_TABLET | ORAL | Status: DC | PRN

## 2015-09-01 MED ORDER — LANOLIN HYDROUS EX OINT: TOPICAL_OINTMENT | CUTANEOUS | Status: DC | PRN

## 2015-09-01 MED ORDER — FENTANYL 2.5 MCG/ML BUPIVACAINE 1/10 % EPIDURAL INFUSION (WH - ANES)
14.0000 mL/h | INTRAMUSCULAR | Status: DC | PRN
  Administered 2015-09-01 (×2): 14 mL/h via EPIDURAL
  Filled 2015-09-01: qty 125

## 2015-09-01 MED ORDER — PENICILLIN G POTASSIUM 5000000 UNITS IJ SOLR
2.5000 10*6.[IU] | INTRAVENOUS | Status: DC
  Administered 2015-09-01: 2.5 10*6.[IU] via INTRAVENOUS
  Filled 2015-09-01 (×6): qty 2.5

## 2015-09-01 MED ORDER — SENNOSIDES-DOCUSATE SODIUM 8.6-50 MG PO TABS
2.0000 | ORAL_TABLET | ORAL | Status: DC
  Administered 2015-09-01 – 2015-09-03 (×2): 2 via ORAL
  Filled 2015-09-01 (×2): qty 2

## 2015-09-01 MED ORDER — DIPHENHYDRAMINE HCL 50 MG/ML IJ SOLN: 12.5000 mg | INTRAMUSCULAR | Status: DC | PRN

## 2015-09-01 MED ORDER — OXYCODONE-ACETAMINOPHEN 5-325 MG PO TABS
1.0000 | ORAL_TABLET | ORAL | Status: DC | PRN
  Administered 2015-09-03: 1 via ORAL
  Filled 2015-09-01: qty 1

## 2015-09-01 MED ORDER — ONDANSETRON HCL 4 MG PO TABS
4.0000 mg | ORAL_TABLET | ORAL | Status: DC | PRN
Start: 2015-09-01 — End: 2015-09-03

## 2015-09-01 MED ORDER — LIDOCAINE HCL (PF) 1 % IJ SOLN
INTRAMUSCULAR | Status: DC | PRN
  Administered 2015-09-01 (×2): 5 mL
  Administered 2015-09-01: 3 mL

## 2015-09-01 MED ORDER — WITCH HAZEL-GLYCERIN EX PADS: 1.0000 "application " | MEDICATED_PAD | CUTANEOUS | Status: DC | PRN

## 2015-09-01 MED ORDER — EPHEDRINE 5 MG/ML INJ
10.0000 mg | INTRAVENOUS | Status: DC | PRN
  Filled 2015-09-01: qty 2

## 2015-09-01 MED ORDER — OXYTOCIN 40 UNITS IN LACTATED RINGERS INFUSION - SIMPLE MED
62.5000 mL/h | INTRAVENOUS | Status: DC
  Administered 2015-09-01: 62.5 mL/h via INTRAVENOUS
  Filled 2015-09-01: qty 1000

## 2015-09-01 MED ORDER — LIDOCAINE HCL (PF) 1 % IJ SOLN
30.0000 mL | INTRAMUSCULAR | Status: DC | PRN
Start: 2015-09-01 — End: 2015-09-01
  Administered 2015-09-01: 30 mL via SUBCUTANEOUS
  Filled 2015-09-01: qty 30

## 2015-09-01 MED ORDER — CITRIC ACID-SODIUM CITRATE 334-500 MG/5ML PO SOLN: 30.0000 mL | ORAL | Status: DC | PRN

## 2015-09-01 MED ORDER — FERROUS SULFATE 325 (65 FE) MG PO TABS
325.0000 mg | ORAL_TABLET | Freq: Two times a day (BID) | ORAL | Status: DC
  Administered 2015-09-02 – 2015-09-03 (×3): 325 mg via ORAL
  Filled 2015-09-01 (×4): qty 1

## 2015-09-01 MED ORDER — PNEUMOCOCCAL VAC POLYVALENT 25 MCG/0.5ML IJ INJ
0.5000 mL | INJECTION | INTRAMUSCULAR | Status: AC
  Administered 2015-09-03: 0.5 mL via INTRAMUSCULAR
  Filled 2015-09-01 (×2): qty 0.5

## 2015-09-01 MED ORDER — TETANUS-DIPHTH-ACELL PERTUSSIS 5-2.5-18.5 LF-MCG/0.5 IM SUSP
0.5000 mL | Freq: Once | INTRAMUSCULAR | Status: AC
  Administered 2015-09-02: 0.5 mL via INTRAMUSCULAR

## 2015-09-01 NOTE — H&P (Signed)
This is Dr. Francoise Ceo dictating the history and physical on  [name]  she's a 24 year old gravida 1 at 40 weeks and 2 days [facility] 08/30/2015 admitted in labor 4 cm she is now 5 cm 100% serous station and received her epidural her GBS is positive and she received penicillin amniotomy was performed and the fluids thin meconium an IUPC was inserted and she is contracting irregularly Past medical history negative Past surgical history negative Social history denies smoking drinking or drug use System review negative Physical well-developed female in labor HEENT negative Lungs clear to P&A Heart regular rhythm heart regular rhythm no murmurs no gallops Breasts negative Abdomen term Pelvic as described above Extremities negative

## 2015-09-01 NOTE — Anesthesia Procedure Notes (Signed)
Epidural Patient location during procedure: OB  Staffing Anesthesiologist: CARIGNAN, PETER Performed by: anesthesiologist   Preanesthetic Checklist Completed: patient identified, site marked, surgical consent, pre-op evaluation, timeout performed, IV checked, risks and benefits discussed and monitors and equipment checked  Epidural Patient position: sitting Prep: DuraPrep Patient monitoring: heart rate, continuous pulse ox and blood pressure Approach: right paramedian Location: L3-L4 Injection technique: LOR saline  Needle:  Needle type: Tuohy  Needle gauge: 17 G Needle length: 9 cm and 9 Needle insertion depth: 5 cm Catheter type: closed end flexible Catheter size: 20 Guage Catheter at skin depth: 10 cm Test dose: negative  Assessment Events: blood not aspirated, injection not painful, no injection resistance, negative IV test and no paresthesia  Additional Notes Patient identified. Risks/Benefits/Options discussed with patient including but not limited to bleeding, infection, nerve damage, paralysis, failed block, incomplete pain control, headache, blood pressure changes, nausea, vomiting, reactions to medication both or allergic, itching and postpartum back pain. Confirmed with bedside nurse the patient's most recent platelet count. Confirmed with patient that they are not currently taking any anticoagulation, have any bleeding history or any family history of bleeding disorders. Patient expressed understanding and wished to proceed. All questions were answered. Sterile technique was used throughout the entire procedure. Please see nursing notes for vital signs. Test dose was given through epidural needle and negative prior to continuing to dose epidural or start infusion. Warning signs of high block given to the patient including shortness of breath, tingling/numbness in hands, complete motor block, or any concerning symptoms with instructions to call for help. Patient was given  instructions on fall risk and not to get out of bed. All questions and concerns addressed with instructions to call with any issues.   

## 2015-09-01 NOTE — MAU Note (Signed)
Pt reports contractions q 3-5 minutes. Denies bleeding or ROM.

## 2015-09-01 NOTE — Anesthesia Postprocedure Evaluation (Signed)
  Anesthesia Post-op Note  Patient: [name]  Procedure(s) Performed: * No procedures listed *  Patient Location: Mother/Baby  Anesthesia Type:Epidural  Level of Consciousness: awake, alert , oriented and patient cooperative  Airway and Oxygen Therapy: Patient Spontanous Breathing  Post-op Pain: none  Post-op Assessment: Post-op Vital signs reviewed, Patient's Cardiovascular Status Stable, Respiratory Function Stable, Patent Airway, No headache, No backache and Patient able to bend at knees              Post-op Vital Signs: Reviewed and stable  Last Vitals:  Filed Vitals:   09/01/15 1350  BP: 135/73  Pulse: 72  Temp: 36.6 C  Resp: 18    Complications: No apparent anesthesia complications

## 2015-09-01 NOTE — Anesthesia Preprocedure Evaluation (Signed)

## 2015-09-02 LAB — CBC
HEMATOCRIT: 28.8 % — AB (ref 36.0–46.0)
HEMOGLOBIN: 9.4 g/dL — AB (ref 12.0–15.0)
MCH: 29.5 pg (ref 26.0–34.0)
MCHC: 32.6 g/dL (ref 30.0–36.0)
MCV: 90.3 fL (ref 78.0–100.0)
Platelets: 289 10*3/uL (ref 150–400)
RBC: 3.19 MIL/uL — AB (ref 3.87–5.11)
RDW: 13.6 % (ref 11.5–15.5)
WBC: 22.8 10*3/uL — ABNORMAL HIGH (ref 4.0–10.5)

## 2015-09-02 NOTE — Lactation Note (Signed)
This note was copied from the chart of Evelyn Aubriana Zuleta. Lactation Consultation Note  P1.  Baby sleeping.  FOB undressed baby to diaper to wake. Hand expression taught to Mom.  Attempted latching baby in football hold but baby asleep. Placed baby STS on mother's chest and will follow up. Both breasts have positional stripes.  Provided comfort gels and taught mother to apply ebm. Reviewed basics and cluster feeding and depth of latch. Mom encouraged to feed baby 8-12 times/24 hours and with feeding cues.  Mom made aware of O/P services, breastfeeding support groups, community resources, and our phone # for post-discharge questions.    Patient Name: [name] [id number] Date: 09/02/2015 Reason for consult: Initial assessment   Maternal Data Has patient been taught Hand Expression?: Yes Does the patient have breastfeeding experience prior to this delivery?: No  Feeding    LATCH Score/Interventions                      Lactation Tools Discussed/Used     Consult Status Consult Status: Follow-up Date: 09/02/15 Follow-up type: In-patient    Dahlia Byes [facility] 09/02/2015, 10:55 AM

## 2015-09-02 NOTE — Progress Notes (Signed)
Patient ID: [name], female   DOB: 1991-05-08, 24 y.o.   MRN: [id number] Postpartum day one Blood pressure 116/81 respiration 18 pulse 65 afebrile Fundus firm Lochia moderate Legs negative doing well

## 2015-09-03 MED ORDER — OXYCODONE-ACETAMINOPHEN 5-325 MG PO TABS: 1.0000 | ORAL_TABLET | ORAL | Status: DC | PRN

## 2015-09-03 MED ORDER — FERROUS SULFATE 325 (65 FE) MG PO TABS: 325.0000 mg | ORAL_TABLET | Freq: Two times a day (BID) | ORAL | Status: DC

## 2015-09-03 MED ORDER — IBUPROFEN 600 MG PO TABS: 600.0000 mg | ORAL_TABLET | Freq: Four times a day (QID) | ORAL | Status: DC | PRN

## 2015-09-03 NOTE — Progress Notes (Signed)
  PPD#2  No complaints Afebrile, VSS Breasts:  Soft, NT Uterus:  Firm, NT Lochia:  Scant Legs:  No calf pains  A/P:  Doing well.  Discharge home.  Charles A. Clearance Coots MD 09-03-15

## 2015-09-03 NOTE — Discharge Summary (Signed)
Obstetric Discharge Summary Reason for Admission: onset of labor Prenatal Procedures: ultrasound Intrapartum Procedures: spontaneous vaginal delivery Postpartum Procedures: none Complications-Operative and Postpartum: none HEMOGLOBIN  Date Value Ref Range Status  09/02/2015 9.4* 12.0 - 15.0 g/dL Final  12/12/7251 66.4 12.2 - 16.2 g/dL Final   HCT  Date Value Ref Range Status  09/02/2015 28.8* 36.0 - 46.0 % Final   HCT, POC  Date Value Ref Range Status  09/03/2013 45.3 37.7 - 47.9 % Final    Physical Exam:  General: alert and no distress Lochia: appropriate Uterine Fundus: firm Incision: none DVT Evaluation: No evidence of DVT seen on physical exam.  Discharge Diagnoses: Term Pregnancy-delivered  Discharge Information: Date: 09/03/2015 Activity: pelvic rest Diet: routine Medications: PNV, Ibuprofen, Colace, Iron and Percocet Condition: stable Instructions: refer to practice specific booklet Discharge to: home Follow-up Information    Follow up with MARSHALL,BERNARD A, MD. Schedule an appointment as soon as possible for a visit in 6 weeks.   Specialty:  Obstetrics and Gynecology   Contact information:   [address] [place] [address] [phone]       Newborn Data: Live born female  Birth Weight: 7 lb 14.1 oz (3575 g) APGAR: 8, 9  Home with mother.  HARPER,CHARLES A 09/03/2015, 6:47 AM

## 2015-09-03 NOTE — Lactation Note (Signed)
This note was copied from the chart of [name]. Lactation Consultation Note Mom has cracked bruised nipples. Wearing comfort gels and supportive bra. Breast are filling. Hand expression of transitional milk easily. Encouraged to pat milk and let air dry on nipples.  Noted when baby was crying a curved tongue w/limited movement. Mom fitted w/#24 NS. Mom taught how to apply & clean nipple shield. Stated felt much better when latched baby. Taught "C" hold to breast in football position for BF.  Encouraged to call for f/u appt. W/LC.  Discussed engorgement and prevention along w/nipple and breast care, monitoring milk transfer, I&O, cluster feeding and growth spurts. Patient Name: [name] [id number] Date: 09/03/2015 Reason for consult: Follow-up assessment;Breast/nipple pain   Maternal Data    Feeding Feeding Type: Breast Fed Length of feed: 15 min (still BF)  LATCH Score/Interventions Latch: Grasps breast easily, tongue down, lips flanged, rhythmical sucking.  Audible Swallowing: Spontaneous and intermittent Intervention(s): Hand expression;Skin to skin Intervention(s): Alternate breast massage  Type of Nipple: Everted at rest and after stimulation  Comfort (Breast/Nipple): Filling, red/small blisters or bruises, mild/mod discomfort Intervention(s): Reverse pressure;Expressed breast milk to nipple  Problem noted: Filling;Cracked, bleeding, blisters, bruises Interventions (Filling): Massage Interventions  (Cracked/bleeding/bruising/blister): Expressed breast milk to nipple Interventions (Mild/moderate discomfort): Hand massage;Hand expression;Comfort gels;Breast shields  Hold (Positioning): Assistance needed to correctly position infant at breast and maintain latch. Intervention(s): Breastfeeding basics reviewed;Support Pillows;Position options;Skin to skin  LATCH Score: 8  Lactation Tools Discussed/Used Tools: Nipple Shields;Comfort gels Nipple shield  size: 24   Consult Status Consult Status: Complete Date: 09/03/15    Charyl Dancer 09/03/2015, 10:14 AM

## 2016-10-16 ENCOUNTER — Ambulatory Visit (INDEPENDENT_AMBULATORY_CARE_PROVIDER_SITE_OTHER): Payer: BC Managed Care – PPO | Admitting: Family Medicine

## 2016-10-16 VITALS — BP 122/80 | HR 91 | Temp 98.3°F | Resp 16 | Ht 69.0 in | Wt 185.0 lb

## 2016-10-16 DIAGNOSIS — N898 Other specified noninflammatory disorders of vagina: Secondary | ICD-10-CM

## 2016-10-16 DIAGNOSIS — R350 Frequency of micturition: Secondary | ICD-10-CM | POA: Diagnosis not present

## 2016-10-16 DIAGNOSIS — N3091 Cystitis, unspecified with hematuria: Secondary | ICD-10-CM

## 2016-10-16 LAB — POCT URINALYSIS DIP (MANUAL ENTRY)
BILIRUBIN UA: NEGATIVE
BILIRUBIN UA: NEGATIVE
Glucose, UA: NEGATIVE
Nitrite, UA: NEGATIVE
PH UA: 7
Spec Grav, UA: 1.015
Urobilinogen, UA: 1

## 2016-10-16 LAB — POCT WET + KOH PREP
Trich by wet prep: ABSENT
Yeast by KOH: ABSENT
Yeast by wet prep: ABSENT

## 2016-10-16 LAB — POC MICROSCOPIC URINALYSIS (UMFC): MUCUS RE: ABSENT

## 2016-10-16 MED ORDER — NITROFURANTOIN MONOHYD MACRO 100 MG PO CAPS: 100.0000 mg | ORAL_CAPSULE | Freq: Two times a day (BID) | ORAL | 0 refills | Status: DC

## 2016-10-16 MED ORDER — PHENAZOPYRIDINE HCL 100 MG PO TABS: 100.0000 mg | ORAL_TABLET | Freq: Three times a day (TID) | ORAL | 0 refills | Status: DC | PRN

## 2016-10-16 NOTE — Patient Instructions (Signed)
The tests for vaginal discharge overall looked okay. If you have persistent vaginal discharge or new discharge after completion of antibiotic for urinary tract infection, we can recheck that test.  Start Macrodantin, pyridium if needed.  Return to the clinic or go to the nearest emergency room if any of your symptoms worsen or new symptoms occur.   Urinary Tract Infection Urinary tract infections (UTIs) can develop anywhere along your urinary tract. Your urinary tract is your body's drainage system for removing wastes and extra water. Your urinary tract includes two kidneys, two ureters, a bladder, and a urethra. Your kidneys are a pair of bean-shaped organs. Each kidney is about the size of your fist. They are located below your ribs, one on each side of your spine. CAUSES Infections are caused by microbes, which are microscopic organisms, including fungi, viruses, and bacteria. These organisms are so small that they can only be seen through a microscope. Bacteria are the microbes that most commonly cause UTIs. SYMPTOMS  Symptoms of UTIs may vary by age and gender of the patient and by the location of the infection. Symptoms in young women typically include a frequent and intense urge to urinate and a painful, burning feeling in the bladder or urethra during urination. Older women and men are more likely to be tired, shaky, and weak and have muscle aches and abdominal pain. A fever may mean the infection is in your kidneys. Other symptoms of a kidney infection include pain in your back or sides below the ribs, nausea, and vomiting. DIAGNOSIS To diagnose a UTI, your caregiver will ask you about your symptoms. Your caregiver will also ask you to provide a urine sample. The urine sample will be tested for bacteria and white blood cells. White blood cells are made by your body to help fight infection. TREATMENT  Typically, UTIs can be treated with medication. Because most UTIs are caused by a bacterial  infection, they usually can be treated with the use of antibiotics. The choice of antibiotic and length of treatment depend on your symptoms and the type of bacteria causing your infection. HOME CARE INSTRUCTIONS  If you were prescribed antibiotics, take them exactly as your caregiver instructs you. Finish the medication even if you feel better after you have only taken some of the medication.  Drink enough water and fluids to keep your urine clear or pale yellow.  Avoid caffeine, tea, and carbonated beverages. They tend to irritate your bladder.  Empty your bladder often. Avoid holding urine for long periods of time.  Empty your bladder before and after sexual intercourse.  After a bowel movement, women should cleanse from front to back. Use each tissue only once. SEEK MEDICAL CARE IF:   You have back pain.  You develop a fever.  Your symptoms do not begin to resolve within 3 days. SEEK IMMEDIATE MEDICAL CARE IF:   You have severe back pain or lower abdominal pain.  You develop chills.  You have nausea or vomiting.  You have continued burning or discomfort with urination. MAKE SURE YOU:   Understand these instructions.  Will watch your condition.  Will get help right away if you are not doing well or get worse.   This information is not intended to replace advice given to you by your health care provider. Make sure you discuss any questions you have with your health care provider.   Document Released: 09/05/2005 Document Revised: 08/17/2015 Document Reviewed: 01/04/2012 Elsevier Interactive Patient Education [employer]2016 Elsevier Inc.  IF you received an x-ray today, you will receive an invoice from [facility]. Please contact [facility] at [phone] with questions or concerns regarding your invoice.   IF you received labwork today, you will receive an invoice from [facility]. Please contact [facility] at [phone] with  questions or concerns regarding your invoice.   Our billing staff will not be able to assist you with questions regarding bills from these companies.  You will be contacted with the lab results as soon as they are available. The fastest way to get your results is to activate your My Chart account. Instructions are located on the last page of this paperwork. If you have not heard from us regarding the results in 2 weeks, please contact this office.

## 2016-10-16 NOTE — Progress Notes (Signed)
By signing my name below, I, Mesha Guinyard, attest that this documentation has been prepared under the direction and in the presence of Meredith StaggersJeffrey Reilly Blades, MD.  Electronically Signed: Arvilla MarketMesha Guinyard, Medical Scribe. 10/16/16. 1:23 PM.  Subjective:    Patient ID: [name], female    DOB: [birth year], 25 y.o.   MRN: [id number]  HPI Chief Complaint  Patient presents with  . Vaginal Discharge    x 2 weeks   . Urinary Frequency    X  today     HPI Comments: [name] is a 25 y.o. female who presents to the [facility] complaining of vaginal discharge. Reports urinary urgency, urinary frequency, and vaginal discharge today. Has not tried any OTC medication for her symptoms. Pt has had BV and yeast infections in the past. Denies nausea, and vomiting.  Patient Active Problem List   Diagnosis Date Noted  . Indication for care or intervention in labor or delivery 09/01/2015  . NVD (normal vaginal delivery) 09/01/2015  . Supervision of normal pregnancy in second trimester 03/09/2015  . Cystitis, recurrent 03/27/2012  . LUMBAGO 03/30/2010  . KNEE PAIN, BILATERAL 03/04/2009  . PES PLANUS 03/04/2009   Past Medical History:  Diagnosis Date  . Herniated disc   . Medical history non-contributory   . UTI (lower urinary tract infection)    recurrent   Past Surgical History:  Procedure Laterality Date  . NO PAST SURGERIES     No Known Allergies Prior to Admission medications   Medication Sig Start Date End Date Taking? Authorizing Provider  norethindrone-ethinyl estradiol (JUNEL FE,GILDESS FE,LOESTRIN FE) 1-20 MG-MCG tablet Take 1 tablet by mouth daily.   Yes Historical Provider, MD   Social History   Social History  . Marital status: Single    Spouse name: N/A  . Number of children: N/A  . Years of education: N/A   Occupational History  . Not on file.   Social History Main Topics  . Smoking status: Former Smoker    Quit date: 11/09/2014  .  Smokeless tobacco: Never Used  . Alcohol use Yes     Comment: not while pregnant  . Drug use:     Types: Marijuana     Comment: last use 5 wks ago  . Sexual activity: Yes    Birth control/ protection: None     Comment: last sex 3 wks ago   Other Topics Concern  . Not on file   Social History Narrative  . No narrative on file   Review of Systems  Gastrointestinal: Negative for nausea and vomiting.  Genitourinary: Positive for frequency, urgency and vaginal discharge.   Objective:  Physical Exam  Constitutional: She appears well-developed and well-nourished. No distress.  HENT:  Head: Normocephalic and atraumatic.  Eyes: Conjunctivae are normal.  Neck: Neck supple.  Cardiovascular: Normal rate, regular rhythm and normal heart sounds.  Exam reveals no gallop and no friction rub.   No murmur heard. Pulmonary/Chest: Effort normal and breath sounds normal. No respiratory distress. She has no wheezes. She has no rales.  Abdominal: Soft. She exhibits no distension. There is no tenderness. There is no CVA tenderness.  Neurological: She is alert.  Skin: Skin is warm and dry.  Psychiatric: She has a normal mood and affect. Her behavior is normal.  Nursing note and vitals reviewed.  BP 122/80 (BP Location: Right Arm, Patient Position: Sitting, Cuff Size: Normal)   Pulse 91   Temp 98.3 F (36.8 C) (Oral)  Resp 16   Ht 5\' 9"  (1.753 m)   Wt 185 lb (83.9 kg)   LMP 10/07/2016 (Approximate)   SpO2 99%   BMI 27.32 kg/m    Results for orders placed or performed in visit on 10/16/16  POCT Microscopic Urinalysis ([facility])  Result Value Ref Range   WBC,UR,HPF,POC Too numerous to count  (A) None WBC/hpf   RBC,UR,HPF,POC Few (A) None RBC/hpf   Bacteria Moderate (A) None, Too numerous to count   Mucus Absent Absent   Epithelial Cells, UR Per Microscopy Few (A) None, Too numerous to count cells/hpf  POCT urinalysis dipstick  Result Value Ref Range   Color, UA yellow yellow   Clarity, UA  clear clear   Glucose, UA negative negative   Bilirubin, UA negative negative   Ketones, POC UA negative negative   Spec Grav, UA 1.015    Blood, UA small (A) negative   pH, UA 7.0    Protein Ur, POC trace (A) negative   Urobilinogen, UA 1.0    Nitrite, UA Negative Negative   Leukocytes, UA moderate (2+) (A) Negative  POCT Wet + KOH Prep  Result Value Ref Range   Yeast by KOH Absent Present, Absent   Yeast by wet prep Absent Present, Absent   WBC by wet prep Moderate (A) None, Few, Too numerous to count   Clue Cells Wet Prep HPF POC Few (A) None, Too numerous to count   Trich by wet prep Absent Present, Absent   Bacteria Wet Prep HPF POC Moderate (A) None, Few, Too numerous to count   Epithelial Cells By [employer] ([facility]) Moderate (A) None, Few, Too numerous to count   RBC,UR,HPF,POC None None RBC/hpf   Assessment & Plan:   [name] is a 25 y.o. female Vaginal discharge - Plan: POCT Microscopic Urinalysis ([facility]), POCT urinalysis dipstick, POCT Wet + KOH Prep  Urinary frequency - Plan: Urine culture  Hemorrhagic cystitis  Early urinary tract infection, no acute concerns on wet prep. Start Macrobid, check urine culture, Pyridium, and if vaginal discharge increases or persists, consider repeat testing after completion of antibiotic. RTC precautions for sooner.  Meds ordered this encounter  Medications  . norethindrone-ethinyl estradiol (JUNEL FE,GILDESS FE,LOESTRIN FE) 1-20 MG-MCG tablet    Sig: Take 1 tablet by mouth daily.  . nitrofurantoin, macrocrystal-monohydrate, (MACROBID) 100 MG capsule    Sig: Take 1 capsule (100 mg total) by mouth 2 (two) times daily.    Dispense:  14 capsule    Refill:  0  . phenazopyridine (PYRIDIUM) 100 MG tablet    Sig: Take 1 tablet (100 mg total) by mouth 3 (three) times daily as needed for pain.    Dispense:  10 tablet    Refill:  0   Patient Instructions   The tests for vaginal discharge overall looked okay. If you have persistent  vaginal discharge or new discharge after completion of antibiotic for urinary tract infection, we can recheck that test.  Start Macrodantin, pyridium if needed.  Return to the clinic or go to the nearest emergency room if any of your symptoms worsen or new symptoms occur.   Urinary Tract Infection Urinary tract infections (UTIs) can develop anywhere along your urinary tract. Your urinary tract is your body's drainage system for removing wastes and extra water. Your urinary tract includes two kidneys, two ureters, a bladder, and a urethra. Your kidneys are a pair of bean-shaped organs. Each kidney is about the size of your fist. They are  located below your ribs, one on each side of your spine. CAUSES Infections are caused by microbes, which are microscopic organisms, including fungi, viruses, and bacteria. These organisms are so small that they can only be seen through a microscope. Bacteria are the microbes that most commonly cause UTIs. SYMPTOMS  Symptoms of UTIs may vary by age and gender of the patient and by the location of the infection. Symptoms in young women typically include a frequent and intense urge to urinate and a painful, burning feeling in the bladder or urethra during urination. Older women and men are more likely to be tired, shaky, and weak and have muscle aches and abdominal pain. A fever may mean the infection is in your kidneys. Other symptoms of a kidney infection include pain in your back or sides below the ribs, nausea, and vomiting. DIAGNOSIS To diagnose a UTI, your caregiver will ask you about your symptoms. Your caregiver will also ask you to provide a urine sample. The urine sample will be tested for bacteria and white blood cells. White blood cells are made by your body to help fight infection. TREATMENT  Typically, UTIs can be treated with medication. Because most UTIs are caused by a bacterial infection, they usually can be treated with the use of antibiotics. The choice  of antibiotic and length of treatment depend on your symptoms and the type of bacteria causing your infection. HOME CARE INSTRUCTIONS  If you were prescribed antibiotics, take them exactly as your caregiver instructs you. Finish the medication even if you feel better after you have only taken some of the medication.  Drink enough water and fluids to keep your urine clear or pale yellow.  Avoid caffeine, tea, and carbonated beverages. They tend to irritate your bladder.  Empty your bladder often. Avoid holding urine for long periods of time.  Empty your bladder before and after sexual intercourse.  After a bowel movement, women should cleanse from front to back. Use each tissue only once. SEEK MEDICAL CARE IF:   You have back pain.  You develop a fever.  Your symptoms do not begin to resolve within 3 days. SEEK IMMEDIATE MEDICAL CARE IF:   You have severe back pain or lower abdominal pain.  You develop chills.  You have nausea or vomiting.  You have continued burning or discomfort with urination. MAKE SURE YOU:   Understand these instructions.  Will watch your condition.  Will get help right away if you are not doing well or get worse.   This information is not intended to replace advice given to you by your health care provider. Make sure you discuss any questions you have with your health care provider.   Document Released: 09/05/2005 Document Revised: 08/17/2015 Document Reviewed: 01/04/2012 Elsevier Interactive Patient Education 2016 [employer].      IF you received an x-ray today, you will receive an invoice from [facility]. Please contact [facility] at [phone] with questions or concerns regarding your invoice.   IF you received labwork today, you will receive an invoice from [facility]. Please contact [facility] at [phone] with questions or concerns regarding your invoice.   Our billing staff will not be  able to assist you with questions regarding bills from these companies.  You will be contacted with the lab results as soon as they are available. The fastest way to get your results is to activate your My Chart account. Instructions are located on the last page of this paperwork. If  you have not heard from us regarding the results in 2 weeks, please contact this office.

## 2016-10-19 LAB — URINE CULTURE

## 2017-12-19 ENCOUNTER — Ambulatory Visit (INDEPENDENT_AMBULATORY_CARE_PROVIDER_SITE_OTHER): Payer: BC Managed Care – PPO | Admitting: Obstetrics & Gynecology

## 2017-12-19 ENCOUNTER — Encounter: Payer: Self-pay | Admitting: Obstetrics & Gynecology

## 2017-12-19 VITALS — BP 123/83 | HR 69 | Ht 70.0 in | Wt 215.0 lb

## 2017-12-19 DIAGNOSIS — Z01419 Encounter for gynecological examination (general) (routine) without abnormal findings: Secondary | ICD-10-CM | POA: Diagnosis not present

## 2017-12-19 DIAGNOSIS — Z3202 Encounter for pregnancy test, result negative: Secondary | ICD-10-CM | POA: Diagnosis not present

## 2017-12-19 DIAGNOSIS — N912 Amenorrhea, unspecified: Secondary | ICD-10-CM

## 2017-12-19 DIAGNOSIS — Z113 Encounter for screening for infections with a predominantly sexual mode of transmission: Secondary | ICD-10-CM | POA: Diagnosis not present

## 2017-12-19 DIAGNOSIS — Z124 Encounter for screening for malignant neoplasm of cervix: Secondary | ICD-10-CM | POA: Diagnosis not present

## 2017-12-19 DIAGNOSIS — Z1151 Encounter for screening for human papillomavirus (HPV): Secondary | ICD-10-CM | POA: Diagnosis not present

## 2017-12-19 LAB — POCT URINE PREGNANCY: PREG TEST UR: NEGATIVE

## 2017-12-19 NOTE — Patient Instructions (Signed)
Etonogestrel implant What is this medicine? ETONOGESTREL (et oh noe JES trel) is a contraceptive (birth control) device. It is used to prevent pregnancy. It can be used for up to 3 years. This medicine may be used for other purposes; ask your health care provider or pharmacist if you have questions. COMMON BRAND NAME(S): Implanon, Nexplanon What should I tell my health care provider before I take this medicine? They need to know if you have any of these conditions: -abnormal vaginal bleeding -blood vessel disease or blood clots -cancer of the breast, cervix, or liver -depression -diabetes -gallbladder disease -headaches -heart disease or recent heart attack -high blood pressure -high cholesterol -kidney disease -liver disease -renal disease -seizures -tobacco smoker -an unusual or allergic reaction to etonogestrel, other hormones, anesthetics or antiseptics, medicines, foods, dyes, or preservatives -pregnant or trying to get pregnant -breast-feeding How should I use this medicine? This device is inserted just under the skin on the inner side of your upper arm by a health care professional. Talk to your pediatrician regarding the use of this medicine in children. Special care may be needed. Overdosage: If you think you have taken too much of this medicine contact a poison control center or emergency room at once. NOTE: This medicine is only for you. Do not share this medicine with others. What if I miss a dose? This does not apply. What may interact with this medicine? Do not take this medicine with any of the following medications: -amprenavir -bosentan -fosamprenavir This medicine may also interact with the following medications: -barbiturate medicines for inducing sleep or treating seizures -certain medicines for fungal infections like ketoconazole and itraconazole -grapefruit juice -griseofulvin -medicines to treat seizures like carbamazepine, felbamate, oxcarbazepine,  phenytoin, topiramate -modafinil -phenylbutazone -rifampin -rufinamide -some medicines to treat HIV infection like atazanavir, indinavir, lopinavir, nelfinavir, tipranavir, ritonavir -St. John's wort This list may not describe all possible interactions. Give your health care provider a list of all the medicines, herbs, non-prescription drugs, or dietary supplements you use. Also tell them if you smoke, drink alcohol, or use illegal drugs. Some items may interact with your medicine. What should I watch for while using this medicine? This product does not protect you against HIV infection (AIDS) or other sexually transmitted diseases. You should be able to feel the implant by pressing your fingertips over the skin where it was inserted. Contact your doctor if you cannot feel the implant, and use a non-hormonal birth control method (such as condoms) until your doctor confirms that the implant is in place. If you feel that the implant may have broken or become bent while in your arm, contact your healthcare provider. What side effects may I notice from receiving this medicine? Side effects that you should report to your doctor or health care professional as soon as possible: -allergic reactions like skin rash, itching or hives, swelling of the face, lips, or tongue -breast lumps -changes in emotions or moods -depressed mood -heavy or prolonged menstrual bleeding -pain, irritation, swelling, or bruising at the insertion site -scar at site of insertion -signs of infection at the insertion site such as fever, and skin redness, pain or discharge -signs of pregnancy -signs and symptoms of a blood clot such as breathing problems; changes in vision; chest pain; severe, sudden headache; pain, swelling, warmth in the leg; trouble speaking; sudden numbness or weakness of the face, arm or leg -signs and symptoms of liver injury like dark yellow or brown urine; general ill feeling or flu-like symptoms;  light-colored   stools; loss of appetite; nausea; right upper belly pain; unusually weak or tired; yellowing of the eyes or skin -unusual vaginal bleeding, discharge -signs and symptoms of a stroke like changes in vision; confusion; trouble speaking or understanding; severe headaches; sudden numbness or weakness of the face, arm or leg; trouble walking; dizziness; loss of balance or coordination Side effects that usually do not require medical attention (report to your doctor or health care professional if they continue or are bothersome): -acne -back pain -breast pain -changes in weight -dizziness -general ill feeling or flu-like symptoms -headache -irregular menstrual bleeding -nausea -sore throat -vaginal irritation or inflammation This list may not describe all possible side effects. Call your doctor for medical advice about side effects. You may report side effects to FDA at 1-800-FDA-1088. Where should I keep my medicine? This drug is given in a hospital or clinic and will not be stored at home. NOTE: This sheet is a summary. It may not cover all possible information. If you have questions about this medicine, talk to your doctor, pharmacist, or health care provider.  2018 Elsevier/Gold Standard (2016-06-14 11:19:22) Intrauterine Device Information An intrauterine device (IUD) is inserted into your uterus to prevent pregnancy. There are two types of IUDs available:  Copper IUD-This type of IUD is wrapped in copper wire and is placed inside the uterus. Copper makes the uterus and fallopian tubes produce a fluid that kills sperm. The copper IUD can stay in place for 10 years.  Hormone IUD-This type of IUD contains the hormone progestin (synthetic progesterone). The hormone thickens the cervical mucus and prevents sperm from entering the uterus. It also thins the uterine lining to prevent implantation of a fertilized egg. The hormone can weaken or kill the sperm that get into the uterus.  One type of hormone IUD can stay in place for 5 years, and another type can stay in place for 3 years.  Your health care provider will make sure you are a good candidate for a contraceptive IUD. Discuss with your health care provider the possible side effects. Advantages of an intrauterine device  IUDs are highly effective, reversible, long acting, and low maintenance.  There are no estrogen-related side effects.  An IUD can be used when breastfeeding.  IUDs are not associated with weight gain.  The copper IUD works immediately after insertion.  The hormone IUD works right away if inserted within 7 days of your period starting. You will need to use a backup method of birth control for 7 days if the hormone IUD is inserted at any other time in your cycle.  The copper IUD does not interfere with your female hormones.  The hormone IUD can make heavy menstrual periods lighter and decrease cramping.  The hormone IUD can be used for 3 or 5 years.  The copper IUD can be used for 10 years. Disadvantages of an intrauterine device  The hormone IUD can be associated with irregular bleeding patterns.  The copper IUD can make your menstrual flow heavier and more painful.  You may experience cramping and vaginal bleeding after insertion. This information is not intended to replace advice given to you by your health care provider. Make sure you discuss any questions you have with your health care provider. Document Released: 10/30/2004 Document Revised: 05/03/2016 Document Reviewed: 05/17/2013 Elsevier Interactive Patient Education  2017 Elsevier Inc.  

## 2017-12-19 NOTE — Progress Notes (Signed)
NGYN patient presents for her Annual Exam today.pt states hse has not had a period since 09/2017 Pt has took 5-6 at home pregnancy test all have been negative. Pt would like pregnancy test in blood draw  UPT : NEGATIVE in office today  Pt noted spotting on yesterday.  Pt states she was on Xulane patch 03/2017 x 1 month.  Pt states she get recurrent BV infections.   LMP:09/26/2017 Not exact   Last pap:03/09/2015  Contraception: None   Pt request STD screening.

## 2017-12-19 NOTE — Progress Notes (Signed)
Patient ID: [name], female   DOB: 12/14/1990, 27 y.o.   MRN: [id number]  Chief Complaint  Patient presents with  . Gynecologic Exam    HPI Evelyn CoeJazmin Kopf is a 27 y.o. female.  G1P1001 Patient's last menstrual period was 09/26/2017 (approximate). She may have had a period in November and she is spotting today. Several UPT were negative She is interested in LARC, possibly and IUD. She uses condoms currently HPI  Past Medical History:  Diagnosis Date  . Herniated disc   . Medical history non-contributory   . UTI (lower urinary tract infection)    recurrent    Past Surgical History:  Procedure Laterality Date  . NO PAST SURGERIES      Family History  Problem Relation Age of Onset  . Hypertension Mother   . Hypertension Maternal Grandmother   . Hypertension Maternal Grandfather     Social History Social History   Tobacco Use  . Smoking status: Former Smoker    Last attempt to quit: 11/09/2014    Years since quitting: 3.1  . Smokeless tobacco: Never Used  Substance Use Topics  . Alcohol use: Yes    Comment: occ  . Drug use: No    No Known Allergies  Current Outpatient Medications  Medication Sig Dispense Refill  . nitrofurantoin, macrocrystal-monohydrate, (MACROBID) 100 MG capsule Take 1 capsule (100 mg total) by mouth 2 (two) times daily. (Patient not taking: Reported on 12/19/2017) 14 capsule 0  . norethindrone-ethinyl estradiol (JUNEL FE,GILDESS FE,LOESTRIN FE) 1-20 MG-MCG tablet Take 1 tablet by mouth daily.    . phenazopyridine (PYRIDIUM) 100 MG tablet Take 1 tablet (100 mg total) by mouth 3 (three) times daily as needed for pain. (Patient not taking: Reported on 12/19/2017) 10 tablet 0   No current facility-administered medications for this visit.     Review of Systems Review of Systems  Constitutional: Negative.   HENT: Negative.   Respiratory: Negative.   Gastrointestinal: Negative.   Endocrine: Negative.   Genitourinary: Positive for  menstrual problem and vaginal bleeding. Negative for vaginal discharge. Hematuria: irregular     Blood pressure 123/83, pulse 69, height 5\' 10"  (1.778 m), weight 215 lb (97.5 kg), last menstrual period 09/26/2017, not currently breastfeeding. Body mass index is 30.85 kg/m.  Physical Exam Physical Exam  Constitutional: She appears well-developed. No distress.  HENT:  Head: Normocephalic and atraumatic.  Neck: Normal range of motion.  Cardiovascular: Normal rate.  Pulmonary/Chest: Effort normal.  Abdominal: Soft. She exhibits no distension. There is no tenderness.  Genitourinary: Uterus normal. Vaginal discharge (scant mucus with spotting) found.  Vitals reviewed. Breasts: breasts appear normal, no suspicious masses, no skin or nipple changes or axillary nodes, nipples pierced.   Data Reviewed Pap negative 02/2015  Assessment      Well woman exam Counseled about LARC Overweight, Body mass index is 30.85 kg/m.  Plan    Yearly exam Information about implant and IUD        Scheryl DarterJames Arnold 12/19/2017, 4:30 PM

## 2017-12-21 LAB — RPR: RPR: NONREACTIVE

## 2017-12-21 LAB — HIV ANTIBODY (ROUTINE TESTING W REFLEX): HIV Screen 4th Generation wRfx: NONREACTIVE

## 2017-12-21 LAB — HEPATITIS C ANTIBODY: Hep C Virus Ab: 0.3 s/co ratio (ref 0.0–0.9)

## 2017-12-21 LAB — HEPATITIS B SURFACE ANTIGEN: HEP B S AG: NEGATIVE

## 2017-12-23 LAB — CERVICOVAGINAL ANCILLARY ONLY
BACTERIAL VAGINITIS: POSITIVE — AB
Candida vaginitis: NEGATIVE
Chlamydia: NEGATIVE
Neisseria Gonorrhea: NEGATIVE
TRICH (WINDOWPATH): NEGATIVE

## 2017-12-24 LAB — CYTOLOGY - PAP: DIAGNOSIS: NEGATIVE

## 2017-12-26 ENCOUNTER — Other Ambulatory Visit: Payer: Self-pay | Admitting: Obstetrics & Gynecology

## 2017-12-26 DIAGNOSIS — B9689 Other specified bacterial agents as the cause of diseases classified elsewhere: Secondary | ICD-10-CM

## 2017-12-26 DIAGNOSIS — N760 Acute vaginitis: Principal | ICD-10-CM

## 2017-12-26 MED ORDER — METRONIDAZOLE 500 MG PO TABS: 500.0000 mg | ORAL_TABLET | Freq: Two times a day (BID) | ORAL | 0 refills | Status: AC

## 2018-01-17 ENCOUNTER — Telehealth: Payer: Self-pay

## 2018-01-17 NOTE — Telephone Encounter (Signed)
TC to pt regarding message. Pt saw you at her Annual exam Contraception was discussed, Pt states she would like to start Nuva ring instead of IUD please advise.

## 2018-01-23 ENCOUNTER — Other Ambulatory Visit: Payer: Self-pay | Admitting: Obstetrics & Gynecology

## 2018-01-23 DIAGNOSIS — Z308 Encounter for other contraceptive management: Secondary | ICD-10-CM

## 2018-01-23 MED ORDER — ETONOGESTREL-ETHINYL ESTRADIOL 0.12-0.015 MG/24HR VA RING: VAGINAL_RING | VAGINAL | 12 refills | Status: DC

## 2018-01-31 ENCOUNTER — Encounter: Payer: Self-pay | Admitting: *Deleted

## 2018-04-25 ENCOUNTER — Other Ambulatory Visit: Payer: Self-pay

## 2018-04-25 DIAGNOSIS — F17210 Nicotine dependence, cigarettes, uncomplicated: Secondary | ICD-10-CM | POA: Diagnosis not present

## 2018-04-25 DIAGNOSIS — R300 Dysuria: Secondary | ICD-10-CM | POA: Diagnosis not present

## 2018-04-25 DIAGNOSIS — K920 Hematemesis: Secondary | ICD-10-CM | POA: Diagnosis present

## 2018-04-25 DIAGNOSIS — R1084 Generalized abdominal pain: Secondary | ICD-10-CM | POA: Insufficient documentation

## 2018-04-25 DIAGNOSIS — K226 Gastro-esophageal laceration-hemorrhage syndrome: Secondary | ICD-10-CM | POA: Diagnosis not present

## 2018-04-26 ENCOUNTER — Encounter (HOSPITAL_COMMUNITY): Payer: Self-pay | Admitting: Emergency Medicine

## 2018-04-26 ENCOUNTER — Emergency Department (HOSPITAL_COMMUNITY)
Admission: EM | Admit: 2018-04-26 | Discharge: 2018-04-26 | Disposition: A | Discharge status: Home / Self Care | Payer: BC Managed Care – PPO | Attending: Emergency Medicine | Admitting: Emergency Medicine

## 2018-04-26 DIAGNOSIS — K226 Gastro-esophageal laceration-hemorrhage syndrome: Secondary | ICD-10-CM

## 2018-04-26 LAB — COMPREHENSIVE METABOLIC PANEL
ALK PHOS: 52 U/L (ref 38–126)
ALT: 18 U/L (ref 14–54)
ANION GAP: 11 (ref 5–15)
AST: 24 U/L (ref 15–41)
Albumin: 4.4 g/dL (ref 3.5–5.0)
BUN: 5 mg/dL — ABNORMAL LOW (ref 6–20)
CO2: 25 mmol/L (ref 22–32)
Calcium: 9.4 mg/dL (ref 8.9–10.3)
Chloride: 105 mmol/L (ref 101–111)
Creatinine, Ser: 0.94 mg/dL (ref 0.44–1.00)
GFR calc Af Amer: >60 mL/min (ref >60)
GFR calc non Af Amer: >60 mL/min (ref >60)
GLUCOSE: 103 mg/dL — AB (ref 65–99)
POTASSIUM: 3.3 mmol/L — AB (ref 3.5–5.1)
SODIUM: 141 mmol/L (ref 135–145)
TOTAL PROTEIN: 8 g/dL (ref 6.5–8.1)
Total Bilirubin: 0.7 mg/dL (ref 0.3–1.2)

## 2018-04-26 LAB — I-STAT BETA HCG BLOOD, ED (MC, WL, AP ONLY): I-stat hCG, quantitative: <5 m[IU]/mL (ref <5)

## 2018-04-26 LAB — CBC
HEMATOCRIT: 39.6 % (ref 36.0–46.0)
HEMOGLOBIN: 13 g/dL (ref 12.0–15.0)
MCH: 30.4 pg (ref 26.0–34.0)
MCHC: 32.8 g/dL (ref 30.0–36.0)
MCV: 92.7 fL (ref 78.0–100.0)
Platelets: 375 10*3/uL (ref 150–400)
RBC: 4.27 MIL/uL (ref 3.87–5.11)
RDW: 12 % (ref 11.5–15.5)
WBC: 12.7 10*3/uL — ABNORMAL HIGH (ref 4.0–10.5)

## 2018-04-26 LAB — TYPE AND SCREEN
ABO/RH(D): O POS
ANTIBODY SCREEN: NEGATIVE

## 2018-04-26 LAB — ABO/RH: ABO/RH(D): O POS

## 2018-04-26 LAB — URINALYSIS, ROUTINE W REFLEX MICROSCOPIC
Glucose, UA: NEGATIVE mg/dL
HGB URINE DIPSTICK: NEGATIVE
Ketones, ur: 20 mg/dL — AB
LEUKOCYTES UA: NEGATIVE
NITRITE: NEGATIVE
PH: 7 (ref 5.0–8.0)
Protein, ur: 100 mg/dL — AB
SPECIFIC GRAVITY, URINE: 1.03 (ref 1.005–1.030)

## 2018-04-26 MED ORDER — ONDANSETRON 4 MG PO TBDP
4.0000 mg | ORAL_TABLET | Freq: Once | ORAL | Status: AC
  Administered 2018-04-26: 4 mg via ORAL
  Filled 2018-04-26: qty 1

## 2018-04-26 MED ORDER — ONDANSETRON 4 MG PO TBDP
4.0000 mg | ORAL_TABLET | Freq: Once | ORAL | Status: AC | PRN
  Administered 2018-04-26: 4 mg via ORAL
  Filled 2018-04-26: qty 1

## 2018-04-26 MED ORDER — ONDANSETRON 4 MG PO TBDP: ORAL_TABLET | ORAL | 0 refills | Status: DC

## 2018-04-26 NOTE — ED Notes (Signed)
Pt verbalized understanding of discharge instructions.

## 2018-04-26 NOTE — ED Triage Notes (Signed)
Reports having two drinks tonight and relaxing when she had sudden onset of nausea.   Reports having bright red blood in vomit.  Has vomited 5 times.

## 2018-04-26 NOTE — ED Provider Notes (Signed)
[facility] EMERGENCY DEPARTMENT Provider Note   CSN: [id number] Arrival date & time: 04/25/18  2356     History   Chief Complaint Chief Complaint  Patient presents with  . Hematemesis    HPI [name] is a 27 y.o. female.  HPI She is a 27 year old female no pertinent past medical history comes in today complaining of nausea with vomiting as well as episodes of hematemesis.  Patient states that she has noticed some chills for the past several days as well as some intermittent dysuria and right flank pain.  Patient went out last night and had approximately 2 alcoholic beverages after which she had sudden onset of nausea and forceful emesis.  Patient had approximately 5 episodes in total with subsequent episodes turning bright red with blood.  Patient denies any lightheadedness dizziness chest pain or shortness of breath.  Patient also endorses some mild diarrhea.  Past Medical History:  Diagnosis Date  . Herniated disc   . Medical history non-contributory   . UTI (lower urinary tract infection)    recurrent    Patient Active Problem List   Diagnosis Date Noted  . Cystitis, recurrent 03/27/2012  . LUMBAGO 03/30/2010  . KNEE PAIN, BILATERAL 03/04/2009  . PES PLANUS 03/04/2009    Past Surgical History:  Procedure Laterality Date  . NO PAST SURGERIES       OB History    Gravida  1   Para  1   Term  1   Preterm      AB      Living  1     SAB      TAB      Ectopic      Multiple  0   Live Births  1            Home Medications    Prior to Admission medications   Medication Sig Start Date End Date Taking? Authorizing Provider  etonogestrel-ethinyl estradiol (NUVARING) 0.12-0.015 MG/24HR vaginal ring Insert vaginally and leave in place for 3 consecutive weeks, then remove for 1 week. 01/23/18   Caetano Oberhaus Phenix, MD  nitrofurantoin, macrocrystal-monohydrate, (MACROBID) 100 MG capsule Take 1 capsule (100 mg total) by mouth 2  (two) times daily. Patient not taking: Reported on 12/19/2017 10/16/16   Shade Flood, MD  norethindrone-ethinyl estradiol (JUNEL FE,GILDESS FE,LOESTRIN FE) 1-20 MG-MCG tablet Take 1 tablet by mouth daily.    [provider]  ondansetron (ZOFRAN ODT) 4 MG disintegrating tablet  ODT q4 hours prn nausea/vomit 04/26/18   Blane Ohara, MD  phenazopyridine (PYRIDIUM) 100 MG tablet Take 1 tablet (100 mg total) by mouth 3 (three) times daily as needed for pain. Patient not taking: Reported on 12/19/2017 10/16/16   Shade Flood, MD    Family History Family History  Problem Relation Age of Onset  . Hypertension Mother   . Hypertension Maternal Grandmother   . Hypertension Maternal Grandfather     Social History Social History   Tobacco Use  . Smoking status: Former Smoker    Last attempt to quit: 11/09/2014    Years since quitting: 3.4  . Smokeless tobacco: Never Used  Substance Use Topics  . Alcohol use: Yes    Comment: occ  . Drug use: No     Allergies   Patient has no known allergies.   Review of Systems Review of Systems  Constitutional: Negative for chills and fever.  Gastrointestinal: Positive for diarrhea, nausea and vomiting. Negative for anal  bleeding and blood in stool.  All other systems reviewed and are negative.    Physical Exam Updated Vital Signs BP 113/70 (BP Location: Right Arm)   Pulse 70   Temp 98.4 F (36.9 C) (Oral)   Resp 18   Ht  (1.753 m)   Wt 95.3 kg (210 lb)   SpO2 96%   BMI 31.01 kg/m   Physical Exam   ED Treatments / Results  Labs (all labs ordered are listed, but only abnormal results are displayed) Labs Reviewed  URINE CULTURE - Abnormal; Notable for the following components:      Result Value   Culture MULTIPLE SPECIES PRESENT, SUGGEST RECOLLECTION (*)    All other components within normal limits  COMPREHENSIVE METABOLIC PANEL - Abnormal; Notable for the following components:   Potassium 3.3 (*)     Glucose, Bld 103 (*)    BUN 5 (*)    All other components within normal limits  CBC - Abnormal; Notable for the following components:   WBC 12.7 (*)    All other components within normal limits  URINALYSIS, ROUTINE W REFLEX MICROSCOPIC - Abnormal; Notable for the following components:   Color, Urine AMBER (*)    APPearance HAZY (*)    Bilirubin Urine SMALL (*)    Ketones, ur 20 (*)    Protein, ur 100 (*)    Bacteria, UA RARE (*)    All other components within normal limits  I-STAT BETA HCG BLOOD, ED ([facility], [facility], [facility] ONLY)  TYPE AND SCREEN  ABO/RH    EKG None  Radiology No results found.  Procedures Procedures (including critical care time)  Medications Ordered in ED Medications  ondansetron (ZOFRAN-ODT) disintegrating tablet 4 mg (4 mg Oral Given 04/26/18 0113)  ondansetron (ZOFRAN-ODT) disintegrating tablet 4 mg (4 mg Oral Given 04/26/18 1612)     Initial Impression / Assessment and Plan / ED Course  I have reviewed the triage vital signs and the nursing notes.  Pertinent labs & imaging results that were available during my care of the patient were reviewed by me and considered in my medical decision making (see chart for details).     Labs collected in first look show no acute concerning abnormalities.  Will check urine given patient's urinary symptoms and right-sided flank tenderness to palpation on exam.  Hemoglobin within normal limits.  Patient's nausea and vomiting have subsided at this time.  UA WNL without concerning findings. Pt given appropriate f/u and return precautions. Pt voiced understanding and is agreeable to discharge at this time.    Final Clinical Impressions(s) / ED Diagnoses   Final diagnoses:  Mallory-Weiss tear    ED Discharge Orders        Ordered    ondansetron (ZOFRAN ODT) 4 MG disintegrating tablet     04/26/18 1700       Caren Griffins, MD 04/27/18 1510    Blane Ohara, MD 04/28/18 [phone]

## 2018-04-27 LAB — URINE CULTURE

## 2018-09-17 ENCOUNTER — Telehealth: Payer: Self-pay

## 2018-09-17 NOTE — Telephone Encounter (Signed)
Patient called and would like to know if she can switch her contraceptive method from the NuvaRing to a pill.

## 2018-09-23 NOTE — Telephone Encounter (Signed)
May switch to TriSprintec 1 po daily 11 refill

## 2018-09-26 ENCOUNTER — Other Ambulatory Visit: Payer: Self-pay

## 2018-09-26 DIAGNOSIS — Z01419 Encounter for gynecological examination (general) (routine) without abnormal findings: Secondary | ICD-10-CM

## 2018-09-26 MED ORDER — NORGESTIM-ETH ESTRAD TRIPHASIC 0.18/0.215/0.25 MG-35 MCG PO TABS
1.0000 | ORAL_TABLET | Freq: Every day | ORAL | 11 refills | Status: DC
Start: 2018-09-26 — End: 2019-08-31

## 2018-09-26 NOTE — Progress Notes (Signed)
Dr Debroah Loop advised pt switch to trisprintec from nuvaring. Rx sent, pt notified via mychart.

## 2018-12-06 ENCOUNTER — Emergency Department (HOSPITAL_BASED_OUTPATIENT_CLINIC_OR_DEPARTMENT_OTHER)
Admission: EM | Admit: 2018-12-06 | Discharge: 2018-12-06 | Disposition: A | Discharge status: Home / Self Care | Payer: BC Managed Care – PPO | Attending: Emergency Medicine | Admitting: Emergency Medicine

## 2018-12-06 ENCOUNTER — Emergency Department (HOSPITAL_BASED_OUTPATIENT_CLINIC_OR_DEPARTMENT_OTHER): Payer: BC Managed Care – PPO

## 2018-12-06 ENCOUNTER — Other Ambulatory Visit: Payer: Self-pay

## 2018-12-06 ENCOUNTER — Encounter (HOSPITAL_BASED_OUTPATIENT_CLINIC_OR_DEPARTMENT_OTHER): Payer: Self-pay | Admitting: *Deleted

## 2018-12-06 DIAGNOSIS — L0291 Cutaneous abscess, unspecified: Secondary | ICD-10-CM

## 2018-12-06 DIAGNOSIS — L03115 Cellulitis of right lower limb: Secondary | ICD-10-CM | POA: Insufficient documentation

## 2018-12-06 DIAGNOSIS — Z87891 Personal history of nicotine dependence: Secondary | ICD-10-CM | POA: Diagnosis not present

## 2018-12-06 DIAGNOSIS — L02415 Cutaneous abscess of right lower limb: Secondary | ICD-10-CM | POA: Insufficient documentation

## 2018-12-06 DIAGNOSIS — Z79899 Other long term (current) drug therapy: Secondary | ICD-10-CM | POA: Insufficient documentation

## 2018-12-06 DIAGNOSIS — R2241 Localized swelling, mass and lump, right lower limb: Secondary | ICD-10-CM | POA: Diagnosis present

## 2018-12-06 MED ORDER — SULFAMETHOXAZOLE-TRIMETHOPRIM 800-160 MG PO TABS: 1.0000 | ORAL_TABLET | Freq: Two times a day (BID) | ORAL | 0 refills | Status: AC

## 2018-12-06 MED ORDER — OXYCODONE-ACETAMINOPHEN 5-325 MG PO TABS: 1.0000 | ORAL_TABLET | Freq: Four times a day (QID) | ORAL | 0 refills | Status: DC | PRN

## 2018-12-06 MED ORDER — OXYCODONE-ACETAMINOPHEN 5-325 MG PO TABS
1.0000 | ORAL_TABLET | Freq: Once | ORAL | Status: AC
  Administered 2018-12-06: 1 via ORAL
  Filled 2018-12-06: qty 1

## 2018-12-06 MED ORDER — LIDOCAINE-EPINEPHRINE (PF) 2 %-1:200000 IJ SOLN
10.0000 mL | Freq: Once | INTRAMUSCULAR | Status: AC
  Administered 2018-12-06: 10 mL
  Filled 2018-12-06 (×2): qty 10

## 2018-12-06 NOTE — ED Provider Notes (Signed)
[facility] EMERGENCY DEPARTMENT Provider Note   CSN: [id number] Arrival date & time: 12/06/18  1300     History   Chief Complaint Chief Complaint  Patient presents with  . Leg Pain    HPI [name] is a 27 y.o. female who presents today for evaluation of right lower leg and swelling with redness and pain worsening for approximately 1 week.  She denies any known injury.  She states that as far she knows this did not start with an ingrown hair.  She denies any systemic symptoms, no fevers or chills.  She denies any possibility of pregnancy.  She has never had any similar lesions before.  Denies any new medications or exposures.  Tetanus is up-to-date.  HPI  Past Medical History:  Diagnosis Date  . Herniated disc   . Medical history non-contributory   . UTI (lower urinary tract infection)    recurrent    Patient Active Problem List   Diagnosis Date Noted  . Cystitis, recurrent 03/27/2012  . LUMBAGO 03/30/2010  . KNEE PAIN, BILATERAL 03/04/2009  . PES PLANUS 03/04/2009    Past Surgical History:  Procedure Laterality Date  . NO PAST SURGERIES       OB History    Gravida  1   Para  1   Term  1   Preterm      AB      Living  1     SAB      TAB      Ectopic      Multiple  0   Live Births  1            Home Medications    Prior to Admission medications   Medication Sig Start Date End Date Taking? Authorizing Provider  etonogestrel-ethinyl estradiol (NUVARING) 0.12-0.015 MG/24HR vaginal ring Insert vaginally and leave in place for 3 consecutive weeks, then remove for 1 week. 01/23/18   Adam PhenixArnold, James G, MD  nitrofurantoin, macrocrystal-monohydrate, (MACROBID) 100 MG capsule Take 1 capsule (100 mg total) by mouth 2 (two) times daily. Patient not taking: Reported on 12/19/2017 10/16/16   Shade FloodGreene, Jeffrey R, MD  norethindrone-ethinyl estradiol (JUNEL FE,GILDESS FE,LOESTRIN FE) 1-20 MG-MCG tablet Take 1 tablet by mouth daily.     [provider]  Norgestimate-Ethinyl Estradiol Triphasic (TRI-SPRINTEC) 0.18/0.215/0.25 MG-35 MCG tablet Take 1 tablet by mouth daily. 09/26/18   Adam PhenixArnold, James G, MD  ondansetron (ZOFRAN ODT) 4 MG disintegrating tablet 4mg  ODT q4 hours prn nausea/vomit 04/26/18   Blane OharaZavitz, Joshua, MD  oxyCODONE-acetaminophen (PERCOCET/ROXICET) 5-325 MG tablet Take 1 tablet by mouth every 6 (six) hours as needed for severe pain. 12/06/18   Cristina GongHammond,  W, PA-C  phenazopyridine (PYRIDIUM) 100 MG tablet Take 1 tablet (100 mg total) by mouth 3 (three) times daily as needed for pain. Patient not taking: Reported on 12/19/2017 10/16/16   Shade FloodGreene, Jeffrey R, MD  sulfamethoxazole-trimethoprim (BACTRIM DS,SEPTRA DS) 800-160 MG tablet Take 1 tablet by mouth 2 (two) times daily for 7 days. 12/06/18 12/13/18  Cristina GongHammond,  W, PA-C    Family History Family History  Problem Relation Age of Onset  . Hypertension Mother   . Hypertension Maternal Grandmother   . Hypertension Maternal Grandfather     Social History Social History   Tobacco Use  . Smoking status: Former Smoker    Last attempt to quit: 11/09/2014    Years since quitting: 4.0  . Smokeless tobacco: Never Used  Substance Use Topics  . Alcohol use:  Yes    Comment: occ  . Drug use: No     Allergies   Patient has no known allergies.   Review of Systems Review of Systems  Constitutional: Negative for activity change and fever.  HENT: Negative for congestion.   Respiratory: Negative for shortness of breath.   Skin: Positive for color change.  All other systems reviewed and are negative.    Physical Exam Updated Vital Signs BP 126/83 (BP Location: Right Arm)   Pulse 69   Temp 98.4 F (36.9 C) (Oral)   Resp 16   Ht 5\' 9"  (1.753 m)   Wt 81.6 kg   LMP 11/28/2018   SpO2 98%   BMI 26.58 kg/m   Physical Exam Vitals signs and nursing note reviewed.  Constitutional:      General: She is not in acute distress.    Appearance: She  is well-developed. She is not diaphoretic.  HENT:     Head: Normocephalic and atraumatic.  Eyes:     General: No scleral icterus.       Right eye: No discharge.        Left eye: No discharge.     Conjunctiva/sclera: Conjunctivae normal.  Neck:     Musculoskeletal: Normal range of motion.  Cardiovascular:     Rate and Rhythm: Normal rate and regular rhythm.  Pulmonary:     Effort: Pulmonary effort is normal. No respiratory distress.     Breath sounds: No stridor.  Abdominal:     General: There is no distension.  Musculoskeletal:        General: No deformity.     Comments: Right lower extremity no pain with ankle or knee range of motion.  Skin:    General: Skin is warm and dry.     Comments: Please see clinical images.  There is a generalized area of redness on the right lateral lower leg which is approximately 10 cm with a centralized area of edema.  Induration generally felt in the center.  There is what appears to be a ingrown hair in the middle of the lesion.  Neurological:     Mental Status: She is alert.     Motor: No abnormal muscle tone.  Psychiatric:        Behavior: Behavior normal.        ED Treatments / Results  Labs (all labs ordered are listed, but only abnormal results are displayed) Labs Reviewed - No data to display  EKG None  Radiology No results found.  Procedures .Marland KitchenIncision and Drainage Date/Time: 12/06/2018 9:24 PM Performed by: Cristina Gong, PA-C Authorized by: Cristina Gong, PA-C   Consent:    Consent obtained:  Verbal   Consent given by:  Patient   Risks discussed:  Bleeding, incomplete drainage, pain and infection (Damage to other structures, need for additional procedures)   Alternatives discussed:  No treatment, alternative treatment and referral Location:    Type:  Abscess   Size:  1x2cm   Location:  Lower extremity   Lower extremity location:  Leg   Leg location:  R lower leg Pre-procedure details:    Skin  preparation:  Chloraprep Anesthesia (see MAR for exact dosages):    Anesthesia method:  Local infiltration   Local anesthetic:  Lidocaine 2% WITH epi Procedure type:    Complexity:  Complex Procedure details:    Incision types:  Stab incision   Incision depth:  Subcutaneous   Scalpel blade:  11   Wound management:  Probed and deloculated and irrigated with saline   Drainage:  Purulent and bloody   Drainage amount:  Moderate   Wound treatment:  Wound left open   Packing materials:  1/4 in iodoform gauze   Amount 1/4" iodoform:  One strip Post-procedure details:    Patient tolerance of procedure:  Tolerated well, no immediate complications Ultrasound ED Soft Tissue Date/Time: 12/06/2018 9:24 PM Performed by: Cristina GongHammond,  W, PA-C Authorized by: Cristina GongHammond,  W, PA-C   Procedure details:    Indications: localization of abscess and evaluate for cellulitis     Transverse view:  Visualized   Longitudinal view:  Visualized   Images: archived   Location:    Location: lower extremity     Side:  Right Findings:     abscess present    cellulitis present   (including critical care time)  Medications Ordered in ED Medications  lidocaine-EPINEPHrine (XYLOCAINE W/EPI) 2 %-1:200000 (PF) injection 10 mL (10 mLs Infiltration Given 12/06/18 1617)  oxyCODONE-acetaminophen (PERCOCET/ROXICET) 5-325 MG per tablet 1 tablet (1 tablet Oral Given 12/06/18 1707)     Initial Impression / Assessment and Plan / ED Course  I have reviewed the triage vital signs and the nursing notes.  Pertinent labs & imaging results that were available during my care of the patient were reviewed by me and considered in my medical decision making (see chart for details).    Patient with skin abscess amenable to incision and drainage with surrounding cellulitis.  Abscess was localized using ultrasound.  After patient gave informed consent I&D was performed.    Abscess cavity was packed with 1 continuous  strip of gauze.  Patient is instructed to remove the gauze in 2 days.  She is given prescription for Bactrim for antibiotics given the extent of surrounding cellulitis.  She reports her tetanus is up-to-date.  Given possible teratogenic effects of Bactrim offered pregnancy test while in the department, patient declines stating that she will take 1 at home before taking antibiotics.  As markings came off during prep of skin she is instructed to mark the surrounding redness once the lido/epi wears off.   PMP queried for the patient.   Return precautions were discussed with patient who states their understanding.  At the time of discharge patient denied any unaddressed complaints or concerns.  Patient is agreeable for discharge home.   Final Clinical Impressions(s) / ED Diagnoses   Final diagnoses:  Abscess  Cellulitis of right lower extremity    ED Discharge Orders         Ordered    oxyCODONE-acetaminophen (PERCOCET/ROXICET) 5-325 MG tablet  Every 6 hours PRN     12/06/18 1702    sulfamethoxazole-trimethoprim (BACTRIM DS,SEPTRA DS) 800-160 MG tablet  2 times daily     12/06/18 1702           Norman ClayHammond,  W, PA-C 12/06/18 2128    Tilden Fossaees, , MD 12/07/18 1119

## 2018-12-06 NOTE — ED Triage Notes (Signed)
Pt with right lower leg swelling, redness and pain x 1 week. Denies known injury.

## 2018-12-06 NOTE — ED Notes (Signed)
ED Provider at bedside. 

## 2018-12-06 NOTE — Discharge Instructions (Addendum)
After 48 hours please pull the packing out of your wound.  Please do not soak or submerge your wound until it is healed.  After 48 hours you may get it wet in the shower once you take the packing out.  As we discussed please consider taking a pregnancy test before starting your antibiotics.   Please take Ibuprofen (Advil, motrin) and Tylenol (acetaminophen) to relieve your pain.  You may take up to 600 MG (3 pills) of normal strength ibuprofen every 8 hours as needed.  In between doses of ibuprofen you make take tylenol, up to 1,000 mg (two extra strength pills).  Do not take more than 3,000 mg tylenol in a 24 hour period.  Please check all medication labels as many medications such as pain and cold medications may contain tylenol.  Do not drink alcohol while taking these medications.  Do not take other NSAID'S while taking ibuprofen (such as aleve or naproxen).  Please take ibuprofen with food to decrease stomach upset.  You may have diarrhea from the antibiotics.  It is very important that you continue to take the antibiotics even if you get diarrhea unless a medical professional tells you that you may stop taking them.  If you stop too early the bacteria you are being treated for will become stronger and you may need different, more powerful antibiotics that have more side effects and worsening diarrhea.  Please stay well hydrated and consider probiotics as they may decrease the severity of your diarrhea.  Please be aware that if you take any hormonal contraception (birth control pills, nexplanon, the ring, etc) that your birth control will not work while you are taking antibiotics and you need to use back up protection as directed on the birth control medication information insert.   Today you received medications that may make you sleepy or impair your ability to make decisions.  For the next 24 hours please do not drive, operate heavy machinery, care for a small child with out another adult present, or  perform any activities that may cause harm to you or someone else if you were to fall asleep or be impaired.   You are being prescribed a medication which may make you sleepy. Please follow up of listed precautions for at least 24 hours after taking one dose.

## 2019-08-31 ENCOUNTER — Encounter: Payer: Self-pay | Admitting: *Deleted

## 2019-08-31 ENCOUNTER — Other Ambulatory Visit: Payer: Self-pay | Admitting: *Deleted

## 2019-08-31 DIAGNOSIS — Z01419 Encounter for gynecological examination (general) (routine) without abnormal findings: Secondary | ICD-10-CM

## 2019-08-31 MED ORDER — NORGESTIM-ETH ESTRAD TRIPHASIC 0.18/0.215/0.25 MG-35 MCG PO TABS: 1.0000 | ORAL_TABLET | Freq: Every day | ORAL | 2 refills | Status: DC

## 2019-08-31 NOTE — Progress Notes (Signed)
Refill on BC pill sent for 3 month supply. Message sent to pt to make her aware of need for AEX.

## 2019-10-16 ENCOUNTER — Other Ambulatory Visit: Payer: Self-pay

## 2019-10-16 DIAGNOSIS — Z20822 Contact with and (suspected) exposure to covid-19: Secondary | ICD-10-CM

## 2019-10-17 LAB — NOVEL CORONAVIRUS, NAA: SARS-CoV-2, NAA: NOT DETECTED

## 2019-10-24 ENCOUNTER — Other Ambulatory Visit: Payer: Self-pay

## 2019-10-24 ENCOUNTER — Emergency Department (HOSPITAL_COMMUNITY)
Admission: EM | Admit: 2019-10-24 | Discharge: 2019-10-25 | Disposition: A | Discharge status: Home / Self Care | Payer: BC Managed Care – PPO | Attending: Emergency Medicine | Admitting: Emergency Medicine

## 2019-10-24 ENCOUNTER — Encounter (HOSPITAL_COMMUNITY): Payer: Self-pay

## 2019-10-24 DIAGNOSIS — Z79899 Other long term (current) drug therapy: Secondary | ICD-10-CM | POA: Insufficient documentation

## 2019-10-24 DIAGNOSIS — K2901 Acute gastritis with bleeding: Secondary | ICD-10-CM | POA: Insufficient documentation

## 2019-10-24 LAB — URINALYSIS, ROUTINE W REFLEX MICROSCOPIC
Bilirubin Urine: NEGATIVE
Glucose, UA: NEGATIVE mg/dL
Hgb urine dipstick: NEGATIVE
Ketones, ur: 5 mg/dL — AB
Leukocytes,Ua: NEGATIVE
Nitrite: NEGATIVE
Protein, ur: NEGATIVE mg/dL
Specific Gravity, Urine: 1.004 — ABNORMAL LOW (ref 1.005–1.030)
pH: 6 (ref 5.0–8.0)

## 2019-10-24 LAB — CBC
HCT: 34.5 % — ABNORMAL LOW (ref 36.0–46.0)
Hemoglobin: 10.9 g/dL — ABNORMAL LOW (ref 12.0–15.0)
MCH: 30.6 pg (ref 26.0–34.0)
MCHC: 31.6 g/dL (ref 30.0–36.0)
MCV: 96.9 fL (ref 80.0–100.0)
Platelets: 332 10*3/uL (ref 150–400)
RBC: 3.56 MIL/uL — ABNORMAL LOW (ref 3.87–5.11)
RDW: 13.1 % (ref 11.5–15.5)
WBC: 15.9 10*3/uL — ABNORMAL HIGH (ref 4.0–10.5)
nRBC: 0 % (ref 0.0–0.2)

## 2019-10-24 LAB — I-STAT BETA HCG BLOOD, ED (MC, WL, AP ONLY): I-stat hCG, quantitative: <5 m[IU]/mL (ref <5)

## 2019-10-24 MED ORDER — SODIUM CHLORIDE 0.9% FLUSH: 3.0000 mL | Freq: Once | INTRAVENOUS | Status: DC

## 2019-10-24 MED ORDER — SODIUM CHLORIDE 0.9 % IV BOLUS
1000.0000 mL | Freq: Once | INTRAVENOUS | Status: AC
  Administered 2019-10-24: 1000 mL via INTRAVENOUS

## 2019-10-24 MED ORDER — FAMOTIDINE IN NACL 20-0.9 MG/50ML-% IV SOLN
20.0000 mg | Freq: Once | INTRAVENOUS | Status: AC
  Administered 2019-10-24: 20 mg via INTRAVENOUS
  Filled 2019-10-24: qty 50

## 2019-10-24 MED ORDER — ONDANSETRON HCL 4 MG/2ML IJ SOLN
4.0000 mg | Freq: Once | INTRAMUSCULAR | Status: AC
  Administered 2019-10-24: 4 mg via INTRAVENOUS
  Filled 2019-10-24: qty 2

## 2019-10-24 NOTE — ED Triage Notes (Signed)
Pt arrived stating this morning she woke up with sharp right lower abdominal pain, reports she began vomiting. States this is happened in the past and diagnosed with possibly ulcers

## 2019-10-24 NOTE — ED Provider Notes (Signed)
[facility] DEPT Provider Note   CSN: [id number] Arrival date & time: 10/24/19  2206     History   Chief Complaint Chief Complaint  Patient presents with  . Abdominal Pain    HPI [name] is a 28 y.o. female.     Patient presents to the emergency department for evaluation of abdominal pain and vomiting.  Patient reports that pain has been right upper abdomen.  It started around 5:00 this morning.  Patient has had recurrent episodes of vomiting associated with the pain.  She also has noticed some small amounts of blood in the vomit.  Patient reports that this has happened several times in the past, has been attributed to possible ulcer.  She has never had GI follow-up for endoscopy.  She has not had any rectal bleeding or melena.     Past Medical History:  Diagnosis Date  . Herniated disc   . Medical history non-contributory   . UTI (lower urinary tract infection)    recurrent    Patient Active Problem List   Diagnosis Date Noted  . Cystitis, recurrent 03/27/2012  . LUMBAGO 03/30/2010  . KNEE PAIN, BILATERAL 03/04/2009  . PES PLANUS 03/04/2009    Past Surgical History:  Procedure Laterality Date  . NO PAST SURGERIES       OB History    Gravida  1   Para  1   Term  1   Preterm      AB      Living  1     SAB      TAB      Ectopic      Multiple  0   Live Births  1            Home Medications    Prior to Admission medications   Medication Sig Start Date End Date Taking? Authorizing Provider  Norgestimate-Ethinyl Estradiol Triphasic (TRI-SPRINTEC) 0.18/0.215/0.25 MG-35 MCG tablet Take 1 tablet by mouth daily. 08/31/19  Yes Woodroe Mode, MD  etonogestrel-ethinyl estradiol (NUVARING) 0.12-0.015 MG/24HR vaginal ring Insert vaginally and leave in place for 3 consecutive weeks, then remove for 1 week. 01/23/18   Woodroe Mode, MD  ondansetron (ZOFRAN) 4 MG tablet Take 1 tablet (4 mg total) by mouth every  6 (six) hours as needed for nausea or vomiting. 10/25/19   Orpah Greek, MD  pantoprazole (PROTONIX) 40 MG tablet Take 1 tablet (40 mg total) by mouth daily. 10/25/19   Orpah Greek, MD  sucralfate (CARAFATE) 1 g tablet Take 1 tablet (1 g total) by mouth 4 (four) times daily -  with meals and at bedtime. 10/25/19   Orpah Greek, MD  norethindrone-ethinyl estradiol (JUNEL FE,GILDESS FE,LOESTRIN FE) 1-20 MG-MCG tablet Take 1 tablet by mouth daily.  10/25/19  [provider]    Family History Family History  Problem Relation Age of Onset  . Hypertension Mother   . Hypertension Maternal Grandmother   . Hypertension Maternal Grandfather     Social History Social History   Tobacco Use  . Smoking status: Former Smoker    Quit date: 11/09/2014    Years since quitting: 4.9  . Smokeless tobacco: Never Used  Substance Use Topics  . Alcohol use: Yes    Comment: occ  . Drug use: No     Allergies   Patient has no known allergies.   Review of Systems Review of Systems  Gastrointestinal: Positive for abdominal pain, nausea and vomiting.  All other systems reviewed and are negative.    Physical Exam Updated Vital Signs BP 128/79 (BP Location: Left Arm)   Pulse 73   Temp 98.6 F (37 C) (Oral)   Resp 16   Ht 5\' 9"  (1.753 m)   Wt 81.6 kg   LMP 10/03/2019   SpO2 100%   BMI 26.58 kg/m   Physical Exam Vitals signs and nursing note reviewed.  Constitutional:      General: She is not in acute distress.    Appearance: Normal appearance. She is well-developed.  HENT:     Head: Normocephalic and atraumatic.     Right Ear: Hearing normal.     Left Ear: Hearing normal.     Nose: Nose normal.  Eyes:     Conjunctiva/sclera: Conjunctivae normal.     Pupils: Pupils are equal, round, and reactive to light.  Neck:     Musculoskeletal: Normal range of motion and neck supple.  Cardiovascular:     Rate and Rhythm: Regular rhythm.     Heart sounds:  S1 normal and S2 normal. No murmur. No friction rub. No gallop.   Pulmonary:     Effort: Pulmonary effort is normal. No respiratory distress.     Breath sounds: Normal breath sounds.  Chest:     Chest wall: No tenderness.  Abdominal:     General: Bowel sounds are normal.     Palpations: Abdomen is soft.     Tenderness: There is abdominal tenderness in the epigastric area. There is no guarding or rebound. Negative signs include Murphy's sign and McBurney's sign.     Hernia: No hernia is present.  Musculoskeletal: Normal range of motion.  Skin:    General: Skin is warm and dry.     Findings: No rash.  Neurological:     Mental Status: She is alert and oriented to person, place, and time.     GCS: GCS eye subscore is 4. GCS verbal subscore is 5. GCS motor subscore is 6.     Cranial Nerves: No cranial nerve deficit.     Sensory: No sensory deficit.     Coordination: Coordination normal.  Psychiatric:        Speech: Speech normal.        Behavior: Behavior normal.        Thought Content: Thought content normal.      ED Treatments / Results  Labs (all labs ordered are listed, but only abnormal results are displayed) Labs Reviewed  COMPREHENSIVE METABOLIC PANEL - Abnormal; Notable for the following components:      Result Value   Glucose, Bld 101 (*)    All other components within normal limits  CBC - Abnormal; Notable for the following components:   WBC 15.9 (*)    RBC 3.56 (*)    Hemoglobin 10.9 (*)    HCT 34.5 (*)    All other components within normal limits  URINALYSIS, ROUTINE W REFLEX MICROSCOPIC - Abnormal; Notable for the following components:   Color, Urine STRAW (*)    Specific Gravity, Urine 1.004 (*)    Ketones, ur 5 (*)    All other components within normal limits  LIPASE, BLOOD  I-STAT BETA HCG BLOOD, ED ([facility], [facility], [facility] ONLY)    EKG None  Radiology No results found.  Procedures Procedures (including critical care time)  Medications Ordered in ED  Medications  sodium chloride flush (NS) 0.9 % injection 3 mL (3 mLs Intravenous Not Given 10/24/19 2322)  sodium chloride 0.9 % bolus 1,000 mL (1,000 mLs Intravenous New Bag/Given 10/24/19 2352)  ondansetron (ZOFRAN) injection 4 mg (4 mg Intravenous Given 10/24/19 2352)  famotidine (PEPCID) IVPB 20 mg premix (0 mg Intravenous Stopped 10/25/19 0022)     Initial Impression / Assessment and Plan / ED Course  I have reviewed the triage vital signs and the nursing notes.  Pertinent labs & imaging results that were available during my care of the patient were reviewed by me and considered in my medical decision making (see chart for details).        Patient presents with complaints of abdominal pain with nausea and vomiting.  Patient had sharp pains in the right and central portion of her upper abdomen which have been present intermittently through the day.  She reports countless numbers of emesis throughout the day, having difficulty holding down liquids.  She has had similar episodes in the past.  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  She does not have any right upper quadrant tenderness to suggest gallbladder disease and normal liver function tests here.  She feels much improved after treatment here in the ER.  Hemoglobin is slightly down but not anywhere near requiring transfusion and she has not had any hematemesis here in the ER.  No signs of any ongoing bleeding, likely Mallory-Weiss but cannot rule out gastric ulcer.  Patient will be referred to GI for follow-up.  Treat for possible gastric ulcer/gastritis and given return precautions for increased blood loss.  Final Clinical Impressions(s) / ED Diagnoses   Final diagnoses:  Acute gastritis with hemorrhage, unspecified gastritis type    ED Discharge Orders         Ordered    ondansetron (ZOFRAN) 4 MG tablet  Every 6 hours PRN     10/25/19 0310     pantoprazole (PROTONIX) 40 MG tablet  Daily     10/25/19 0310    sucralfate (CARAFATE) 1 g tablet  3 times daily with meals & bedtime     10/25/19 0310           Gilda Crease, MD 10/25/19 [phone]

## 2019-10-25 LAB — COMPREHENSIVE METABOLIC PANEL
ALT: 17 U/L (ref 0–44)
AST: 21 U/L (ref 15–41)
Albumin: 4.5 g/dL (ref 3.5–5.0)
Alkaline Phosphatase: 42 U/L (ref 38–126)
Anion gap: 10 (ref 5–15)
BUN: 9 mg/dL (ref 6–20)
CO2: 24 mmol/L (ref 22–32)
Calcium: 9.5 mg/dL (ref 8.9–10.3)
Chloride: 104 mmol/L (ref 98–111)
Creatinine, Ser: 0.9 mg/dL (ref 0.44–1.00)
GFR calc Af Amer: >60 mL/min (ref >60)
GFR calc non Af Amer: >60 mL/min (ref >60)
Glucose, Bld: 101 mg/dL — ABNORMAL HIGH (ref 70–99)
Potassium: 3.6 mmol/L (ref 3.5–5.1)
Sodium: 138 mmol/L (ref 135–145)
Total Bilirubin: 0.8 mg/dL (ref 0.3–1.2)
Total Protein: 7.9 g/dL (ref 6.5–8.1)

## 2019-10-25 LAB — LIPASE, BLOOD: Lipase: 20 U/L (ref 11–51)

## 2019-10-25 MED ORDER — ONDANSETRON HCL 4 MG PO TABS: 4.0000 mg | ORAL_TABLET | Freq: Four times a day (QID) | ORAL | 0 refills | Status: DC | PRN

## 2019-10-25 MED ORDER — SUCRALFATE 1 G PO TABS: 1.0000 g | ORAL_TABLET | Freq: Three times a day (TID) | ORAL | 0 refills | Status: DC

## 2019-10-25 MED ORDER — PANTOPRAZOLE SODIUM 40 MG PO TBEC: 40.0000 mg | DELAYED_RELEASE_TABLET | Freq: Every day | ORAL | 3 refills | Status: DC

## 2019-11-10 ENCOUNTER — Ambulatory Visit: Payer: Medicaid Other | Admitting: Obstetrics & Gynecology

## 2019-11-19 ENCOUNTER — Ambulatory Visit (INDEPENDENT_AMBULATORY_CARE_PROVIDER_SITE_OTHER): Payer: Self-pay | Admitting: Obstetrics & Gynecology

## 2019-11-19 ENCOUNTER — Other Ambulatory Visit (HOSPITAL_COMMUNITY)
Admission: RE | Admit: 2019-11-19 | Discharge: 2019-11-19 | Disposition: A | Discharge status: Home / Self Care | Payer: Medicaid Other | Source: Ambulatory Visit | Attending: Obstetrics & Gynecology | Admitting: Obstetrics & Gynecology

## 2019-11-19 ENCOUNTER — Encounter: Payer: Self-pay | Admitting: Obstetrics & Gynecology

## 2019-11-19 ENCOUNTER — Other Ambulatory Visit: Payer: Self-pay

## 2019-11-19 VITALS — BP 119/78 | HR 74 | Ht 69.0 in | Wt 196.0 lb

## 2019-11-19 DIAGNOSIS — Z01419 Encounter for gynecological examination (general) (routine) without abnormal findings: Secondary | ICD-10-CM

## 2019-11-19 DIAGNOSIS — Z3041 Encounter for surveillance of contraceptive pills: Secondary | ICD-10-CM

## 2019-11-19 MED ORDER — NORGESTIM-ETH ESTRAD TRIPHASIC 0.18/0.215/0.25 MG-35 MCG PO TABS: 1.0000 | ORAL_TABLET | Freq: Every day | ORAL | 11 refills | Status: DC

## 2019-11-19 NOTE — Progress Notes (Signed)
Patient ID: [name], female   DOB: 06-19-1991, 28 y.o.   MRN: [id number]  Chief Complaint  Patient presents with  . Gynecologic Exam  Family planning Medicaid  HPI [name] is a 28 y.o. female.  [id number] Patient's last menstrual period was 09/30/2019. She is doing well using oral contraceptives and will continue for now. She would consider LARC however.  HPI  Past Medical History:  Diagnosis Date  . Herniated disc   . Medical history non-contributory   . UTI (lower urinary tract infection)    recurrent    Past Surgical History:  Procedure Laterality Date  . NO PAST SURGERIES      Family History  Problem Relation Age of Onset  . Hypertension Mother   . Hypertension Maternal Grandmother   . Hypertension Maternal Grandfather     Social History Social History   Tobacco Use  . Smoking status: Former Smoker    Quit date: 11/09/2014    Years since quitting: 5.0  . Smokeless tobacco: Never Used  Substance Use Topics  . Alcohol use: Yes    Comment: occ  . Drug use: No    No Known Allergies  Current Outpatient Medications  Medication Sig Dispense Refill  . Norgestimate-Ethinyl Estradiol Triphasic (TRI-SPRINTEC) 0.18/0.215/0.25 MG-35 MCG tablet Take 1 tablet by mouth daily. 1 Package 11  . etonogestrel-ethinyl estradiol (NUVARING) 0.12-0.015 MG/24HR vaginal ring Insert vaginally and leave in place for 3 consecutive weeks, then remove for 1 week. (Patient not taking: Reported on 11/19/2019) 1 each 12  . ondansetron (ZOFRAN) 4 MG tablet Take 1 tablet (4 mg total) by mouth every 6 (six) hours as needed for nausea or vomiting. (Patient not taking: Reported on 11/19/2019) 20 tablet 0  . pantoprazole (PROTONIX) 40 MG tablet Take 1 tablet (40 mg total) by mouth daily. (Patient not taking: Reported on 11/19/2019) 30 tablet 3  . sucralfate (CARAFATE) 1 g tablet Take 1 tablet (1 g total) by mouth 4 (four) times daily -  with meals and at bedtime. (Patient not taking:  Reported on 11/19/2019) 40 tablet 0   No current facility-administered medications for this visit.    Review of Systems Review of Systems  Constitutional: Negative.   Respiratory: Negative.   Cardiovascular: Negative.   Gastrointestinal: Positive for nausea.  Genitourinary: Negative for menstrual problem, vaginal bleeding and vaginal discharge.  Musculoskeletal: Negative.   Neurological: Negative.     Blood pressure 119/78, pulse 74, height 5\' 9"  (1.753 m), weight 196 lb (88.9 kg), last menstrual period 09/30/2019.  Physical Exam Physical Exam Vitals and nursing note reviewed.  Constitutional:      Appearance: Normal appearance. She is not ill-appearing.  Cardiovascular:     Rate and Rhythm: Normal rate.  Pulmonary:     Effort: Pulmonary effort is normal.  Abdominal:     General: Abdomen is flat. There is no distension.  Musculoskeletal:        General: Normal range of motion.  Skin:    General: Skin is warm and dry.  Neurological:     General: No focal deficit present.     Mental Status: She is alert.  Psychiatric:        Mood and Affect: Mood normal.     Data Reviewed Component 1 yr ago  Adequacy Satisfactory for evaluation endocervical/transformation zone component PRESENT.   Diagnosis NEGATIVE FOR INTRAEPITHELIAL LESIONS OR MALIGNANCY.   Material Submitted CervicoVaginal Pap [ThinPrep Imaged]   Resulting Agency [facility]     Assessment Oral  contraceptive pill surveillance  Encounter for well woman exam with routine gynecological exam - Plan: Cervicovaginal ancillary only( [facility]), Norgestimate-Ethinyl Estradiol Triphasic (TRI-SPRINTEC) 0.18/0.215/0.25 MG-35 MCG tablet    Plan RTC  Year Contraceptive information was given and she may decide to have Nexplanon or IUD    [name] 11/19/2019, 11:42 AM

## 2019-11-19 NOTE — Progress Notes (Addendum)
GYN presents for AEX. Last PAP 12/19/2017.  Reports no problems today.  Needs refills on BC Pills.

## 2019-11-19 NOTE — Patient Instructions (Signed)
Contraception Choices Contraception, also called birth control, refers to methods or devices that prevent pregnancy. Hormonal methods Contraceptive implant  A contraceptive implant is a thin, plastic tube that contains a hormone. It is inserted into the upper part of the arm. It can remain in place for up to 3 years. Progestin-only injections Progestin-only injections are injections of progestin, a synthetic form of the hormone progesterone. They are given every 3 months by a health care provider. Birth control pills  Birth control pills are pills that contain hormones that prevent pregnancy. They must be taken once a day, preferably at the same time each day. Birth control patch  The birth control patch contains hormones that prevent pregnancy. It is placed on the skin and must be changed once a week for three weeks and removed on the fourth week. A prescription is needed to use this method of contraception. Vaginal ring  A vaginal ring contains hormones that prevent pregnancy. It is placed in the vagina for three weeks and removed on the fourth week. After that, the process is repeated with a new ring. A prescription is needed to use this method of contraception. Emergency contraceptive Emergency contraceptives prevent pregnancy after unprotected sex. They come in pill form and can be taken up to 5 days after sex. They work best the sooner they are taken after having sex. Most emergency contraceptives are available without a prescription. This method should not be used as your only form of birth control. Barrier methods Female condom  A female condom is a thin sheath that is worn over the penis during sex. Condoms keep sperm from going inside a woman's body. They can be used with a spermicide to increase their effectiveness. They should be disposed after a single use. Female condom  A female condom is a soft, loose-fitting sheath that is put into the vagina before sex. The condom keeps sperm  from going inside a woman's body. They should be disposed after a single use. Diaphragm  A diaphragm is a soft, dome-shaped barrier. It is inserted into the vagina before sex, along with a spermicide. The diaphragm blocks sperm from entering the uterus, and the spermicide kills sperm. A diaphragm should be left in the vagina for 6-8 hours after sex and removed within 24 hours. A diaphragm is prescribed and fitted by a health care provider. A diaphragm should be replaced every 1-2 years, after giving birth, after gaining more than 15 lb (6.8 kg), and after pelvic surgery. Cervical cap  A cervical cap is a round, soft latex or plastic cup that fits over the cervix. It is inserted into the vagina before sex, along with spermicide. It blocks sperm from entering the uterus. The cap should be left in place for 6-8 hours after sex and removed within 48 hours. A cervical cap must be prescribed and fitted by a health care provider. It should be replaced every 2 years. Sponge  A sponge is a soft, circular piece of polyurethane foam with spermicide on it. The sponge helps block sperm from entering the uterus, and the spermicide kills sperm. To use it, you make it wet and then insert it into the vagina. It should be inserted before sex, left in for at least 6 hours after sex, and removed and thrown away within 30 hours. Spermicides Spermicides are chemicals that kill or block sperm from entering the cervix and uterus. They can come as a cream, jelly, suppository, foam, or tablet. A spermicide should be inserted into the   vagina with an applicator at least 10-15 minutes before sex to allow time for it to work. The process must be repeated every time you have sex. Spermicides do not require a prescription. Intrauterine contraception Intrauterine device (IUD) An IUD is a T-shaped device that is put in a woman's uterus. There are two types:  Hormone IUD.This type contains progestin, a synthetic form of the hormone  progesterone. This type can stay in place for 3-5 years.  Copper IUD.This type is wrapped in copper wire. It can stay in place for 10 years.  Permanent methods of contraception Female tubal ligation In this method, a woman's fallopian tubes are sealed, tied, or blocked during surgery to prevent eggs from traveling to the uterus. Hysteroscopic sterilization In this method, a small, flexible insert is placed into each fallopian tube. The inserts cause scar tissue to form in the fallopian tubes and block them, so sperm cannot reach an egg. The procedure takes about 3 months to be effective. Another form of birth control must be used during those 3 months. Female sterilization This is a procedure to tie off the tubes that carry sperm (vasectomy). After the procedure, the man can still ejaculate fluid (semen). Natural planning methods Natural family planning In this method, a couple does not have sex on days when the woman could become pregnant. Calendar method This means keeping track of the length of each menstrual cycle, identifying the days when pregnancy can happen, and not having sex on those days. Ovulation method In this method, a couple avoids sex during ovulation. Symptothermal method This method involves not having sex during ovulation. The woman typically checks for ovulation by watching changes in her temperature and in the consistency of cervical mucus. Post-ovulation method In this method, a couple waits to have sex until after ovulation. Summary  Contraception, also called birth control, means methods or devices that prevent pregnancy.  Hormonal methods of contraception include implants, injections, pills, patches, vaginal rings, and emergency contraceptives.  Barrier methods of contraception can include female condoms, female condoms, diaphragms, cervical caps, sponges, and spermicides.  There are two types of IUDs (intrauterine devices). An IUD can be put in a woman's uterus to  prevent pregnancy for 3-5 years.  Permanent sterilization can be done through a procedure for males, females, or both.  Natural family planning methods involve not having sex on days when the woman could become pregnant. This information is not intended to replace advice given to you by your health care provider. Make sure you discuss any questions you have with your health care provider. Document Released: 11/26/2005 Document Revised: 11/28/2017 Document Reviewed: 12/29/2016 Elsevier Patient Education  2020 [employer]  

## 2019-11-20 LAB — CERVICOVAGINAL ANCILLARY ONLY
Chlamydia: NEGATIVE
Comment: NEGATIVE
Comment: NORMAL
Neisseria Gonorrhea: NEGATIVE

## 2020-01-14 ENCOUNTER — Emergency Department (HOSPITAL_BASED_OUTPATIENT_CLINIC_OR_DEPARTMENT_OTHER): Payer: Medicaid Other

## 2020-01-14 ENCOUNTER — Emergency Department (HOSPITAL_BASED_OUTPATIENT_CLINIC_OR_DEPARTMENT_OTHER)
Admission: EM | Admit: 2020-01-14 | Discharge: 2020-01-14 | Disposition: A | Discharge status: Home / Self Care | Payer: Medicaid Other | Attending: Emergency Medicine | Admitting: Emergency Medicine

## 2020-01-14 ENCOUNTER — Encounter (HOSPITAL_BASED_OUTPATIENT_CLINIC_OR_DEPARTMENT_OTHER): Payer: Self-pay | Admitting: *Deleted

## 2020-01-14 ENCOUNTER — Other Ambulatory Visit: Payer: Self-pay

## 2020-01-14 DIAGNOSIS — Y929 Unspecified place or not applicable: Secondary | ICD-10-CM | POA: Insufficient documentation

## 2020-01-14 DIAGNOSIS — S8012XA Contusion of left lower leg, initial encounter: Secondary | ICD-10-CM | POA: Insufficient documentation

## 2020-01-14 DIAGNOSIS — Z23 Encounter for immunization: Secondary | ICD-10-CM | POA: Insufficient documentation

## 2020-01-14 DIAGNOSIS — Y999 Unspecified external cause status: Secondary | ICD-10-CM | POA: Insufficient documentation

## 2020-01-14 DIAGNOSIS — Z87891 Personal history of nicotine dependence: Secondary | ICD-10-CM | POA: Insufficient documentation

## 2020-01-14 DIAGNOSIS — M79605 Pain in left leg: Secondary | ICD-10-CM | POA: Insufficient documentation

## 2020-01-14 DIAGNOSIS — Y939 Activity, unspecified: Secondary | ICD-10-CM | POA: Insufficient documentation

## 2020-01-14 DIAGNOSIS — Z793 Long term (current) use of hormonal contraceptives: Secondary | ICD-10-CM | POA: Insufficient documentation

## 2020-01-14 DIAGNOSIS — S8992XA Unspecified injury of left lower leg, initial encounter: Secondary | ICD-10-CM

## 2020-01-14 DIAGNOSIS — W01198A Fall on same level from slipping, tripping and stumbling with subsequent striking against other object, initial encounter: Secondary | ICD-10-CM | POA: Insufficient documentation

## 2020-01-14 MED ORDER — NAPROXEN 500 MG PO TABS: 500.0000 mg | ORAL_TABLET | Freq: Two times a day (BID) | ORAL | 0 refills | Status: DC

## 2020-01-14 MED ORDER — IBUPROFEN 800 MG PO TABS
800.0000 mg | ORAL_TABLET | Freq: Once | ORAL | Status: AC
  Administered 2020-01-14: 800 mg via ORAL
  Filled 2020-01-14: qty 1

## 2020-01-14 MED ORDER — TETANUS-DIPHTH-ACELL PERTUSSIS 5-2.5-18.5 LF-MCG/0.5 IM SUSP
0.5000 mL | Freq: Once | INTRAMUSCULAR | Status: AC
  Administered 2020-01-14: 14:00:00 0.5 mL via INTRAMUSCULAR
  Filled 2020-01-14: qty 0.5

## 2020-01-14 MED FILL — NAPROXEN 500 MG TABS: 500 | 15 days supply | Qty: 30 | Fill #0

## 2020-01-14 NOTE — ED Notes (Signed)
Patient verbalizes understanding of discharge instructions. Opportunity for questioning and answers were provided. Armband removed by staff, pt discharged from ED.  

## 2020-01-14 NOTE — Discharge Instructions (Addendum)
As discussed, your x-ray was negative for broken bones. Your ultrasound did not show a blood clot. Continue to monitor your symptoms. If you leg becomes more red, swollen, or warmer return to the ER. Return if you develop chest pain or shortness of breath. I am sending you home with naproxen. Take as prescribed. Do not mix with other over the counter medications. Return to the ER for new or worsening symptoms.

## 2020-01-14 NOTE — ED Triage Notes (Signed)
She fell into a dumpster a week ago. Injury to her left leg with bruising noted. She is ambulatory.

## 2020-01-14 NOTE — ED Provider Notes (Signed)
[facility] EMERGENCY DEPARTMENT Provider Note   CSN: [id number] Arrival date & time: 01/14/20  1055     History Chief Complaint  Patient presents with  . Leg Injury    [name] is a 29 y.o. female no significant past medical history who presents to the ED due to worsening left leg pain x5 days.  Patient states she fell into a dumpster on Sunday on her left leg. Patient notes ecchymosis on the medial aspect of her left lower leg and posterior aspect of her left upper leg. Patient unsure when her last tetanus shot was, but does not believe it was within the past 5 years. Patient admits to 7/10 pain, worse with movement. She has tried Witch hazel and ibuprofen with moderate relief. Left leg pain is associated with decreased ROM of left knee. Patient notes she is able to ambulate with pain. Patient denies history of blood clots, recent surgeries, and recent long immobilizations. She is currently on oral contraceptives. Patient denies chest pain and shortness of breath.     Past Medical History:  Diagnosis Date  . Herniated disc   . Medical history non-contributory   . UTI (lower urinary tract infection)    recurrent    Patient Active Problem List   Diagnosis Date Noted  . Cystitis, recurrent 03/27/2012  . LUMBAGO 03/30/2010  . KNEE PAIN, BILATERAL 03/04/2009  . PES PLANUS 03/04/2009    Past Surgical History:  Procedure Laterality Date  . NO PAST SURGERIES       OB History    Gravida  1   Para  1   Term  1   Preterm      AB      Living  1     SAB      TAB      Ectopic      Multiple  0   Live Births  1           Family History  Problem Relation Age of Onset  . Hypertension Mother   . Hypertension Maternal Grandmother   . Hypertension Maternal Grandfather     Social History   Tobacco Use  . Smoking status: Former Smoker    Quit date: 11/09/2014    Years since quitting: 5.1  . Smokeless tobacco: Never Used  Substance Use Topics   . Alcohol use: Yes    Comment: occ  . Drug use: No    Home Medications Prior to Admission medications   Medication Sig Start Date End Date Taking? Authorizing Provider  Norgestimate-Ethinyl Estradiol Triphasic (TRI-SPRINTEC) 0.18/0.215/0.25 MG-35 MCG tablet Take 1 tablet by mouth daily. 11/19/19  Yes Woodroe Mode, MD  etonogestrel-ethinyl estradiol (NUVARING) 0.12-0.015 MG/24HR vaginal ring Insert vaginally and leave in place for 3 consecutive weeks, then remove for 1 week. Patient not taking: Reported on 11/19/2019 01/23/18   Woodroe Mode, MD  naproxen (NAPROSYN) 500 MG tablet Take 1 tablet (500 mg total) by mouth 2 (two) times daily. 01/14/20   Suzy Bouchard, PA-C  ondansetron (ZOFRAN) 4 MG tablet Take 1 tablet (4 mg total) by mouth every 6 (six) hours as needed for nausea or vomiting. Patient not taking: Reported on 11/19/2019 10/25/19   Orpah Greek, MD  pantoprazole (PROTONIX) 40 MG tablet Take 1 tablet (40 mg total) by mouth daily. Patient not taking: Reported on 11/19/2019 10/25/19   Orpah Greek, MD  sucralfate (CARAFATE) 1 g tablet Take 1 tablet (1 g total) by mouth  4 (four) times daily -  with meals and at bedtime. Patient not taking: Reported on 11/19/2019 10/25/19   Gilda Crease, MD  norethindrone-ethinyl estradiol (JUNEL FE,GILDESS FE,LOESTRIN FE) 1-20 MG-MCG tablet Take 1 tablet by mouth daily.  10/25/19  [provider]    Allergies    Patient has no known allergies.  Review of Systems   Review of Systems  Constitutional: Negative for chills and fever.  Respiratory: Negative for shortness of breath.   Cardiovascular: Negative for chest pain.  Musculoskeletal: Positive for arthralgias (left leg pain) and gait problem.  Skin: Positive for color change and wound.  All other systems reviewed and are negative.   Physical Exam Updated Vital Signs BP 133/73 (BP Location: Right Arm)   Pulse 77   Temp 99 F (37.2 C) (Oral)    Resp 16   Ht 5\' 9"  (1.753 m)   Wt 86.2 kg   LMP 12/31/2019   SpO2 100%   BMI 28.06 kg/m   Physical Exam Vitals and nursing note reviewed.  Constitutional:      General: She is not in acute distress.    Appearance: She is not ill-appearing.  HENT:     Head: Normocephalic.  Eyes:     Pupils: Pupils are equal, round, and reactive to light.  Cardiovascular:     Rate and Rhythm: Normal rate and regular rhythm.     Pulses: Normal pulses.     Heart sounds: Normal heart sounds. No murmur. No friction rub. No gallop.   Pulmonary:     Effort: Pulmonary effort is normal.     Breath sounds: Normal breath sounds.  Abdominal:     General: Abdomen is flat. Bowel sounds are normal. There is no distension.     Palpations: Abdomen is soft.  Musculoskeletal:     Cervical back: Neck supple.     Comments: Tenderness to palpation over medial aspect inferior to left knee with surrounding ecchymosis. Full range of motion of left hip, knee, and ankle.  Neurovascularly intact.  Able to ambulate in the ED without difficulty.  Soft compartments.  Skin:    Comments: Large area of erythema with a smaller inner portion of induration over medial aspect of left lower extremity.  Smaller areas of ecchymosis on posterior aspect of left upper lower extremity.  See photos below.  Neurological:     General: No focal deficit present.     Mental Status: She is alert.  Psychiatric:        Mood and Affect: Mood normal.        Behavior: Behavior normal.         ED Results / Procedures / Treatments   Labs (all labs ordered are listed, but only abnormal results are displayed) Labs Reviewed - No data to display  EKG None  Radiology DG Tibia/Fibula Left  Result Date: 01/14/2020 CLINICAL DATA:  Prior injury.  Knee pain. EXAM: LEFT TIBIA AND FIBULA - 2 VIEW COMPARISON:  No prior. FINDINGS: No radiopaque foreign body. No acute bony or joint abnormality identified. No evidence of fracture or dislocation. No  acute abnormality identified. IMPRESSION: No acute or focal abnormality. Electronically Signed   By: 03/13/2020  Register   On: 01/14/2020 14:01   03/13/2020 Venous Img Lower Unilateral Left  Result Date: 01/14/2020 CLINICAL DATA:  Pain and edema, history of injury EXAM: LEFT LOWER EXTREMITY VENOUS DOPPLER ULTRASOUND TECHNIQUE: Gray-scale sonography with graded compression, as well as color Doppler and duplex ultrasound were performed to  evaluate the lower extremity deep venous systems from the level of the common femoral vein and including the common femoral, femoral, profunda femoral, popliteal and calf veins including the posterior tibial, peroneal and gastrocnemius veins when visible. The superficial great saphenous vein was also interrogated. Spectral Doppler was utilized to evaluate flow at rest and with distal augmentation maneuvers in the common femoral, femoral and popliteal veins. COMPARISON:  None. FINDINGS: Contralateral Common Femoral Vein: Respiratory phasicity is normal and symmetric with the symptomatic side. No evidence of thrombus. Normal compressibility. Common Femoral Vein: No evidence of thrombus. Normal compressibility, respiratory phasicity and response to augmentation. Saphenofemoral Junction: No evidence of thrombus. Normal compressibility and flow on color Doppler imaging. Profunda Femoral Vein: No evidence of thrombus. Normal compressibility and flow on color Doppler imaging. Femoral Vein: No evidence of thrombus. Normal compressibility, respiratory phasicity and response to augmentation. Popliteal Vein: No evidence of thrombus. Normal compressibility, respiratory phasicity and response to augmentation. Calf Veins: Limited visualization of the calf veins due to pain and bruising. No gross occlusive thrombus appreciated. IMPRESSION: No significant left lower extremity DVT. Limited assessment of the calf veins. Electronically Signed   By: Judie Petit.  Shick M.D.   On: 01/14/2020 15:41     Procedures Procedures (including critical care time)  Medications Ordered in ED Medications  ibuprofen (ADVIL) tablet 800 mg (800 mg Oral Given 01/14/20 1355)  Tdap (BOOSTRIX) injection 0.5 mL (0.5 mLs Intramuscular Given 01/14/20 1355)    ED Course  I have reviewed the triage vital signs and the nursing notes.  Pertinent labs & imaging results that were available during my care of the patient were reviewed by me and considered in my medical decision making (see chart for details).  Clinical Course as of Jan 13 1613  Thu Jan 14, 2020  1438 Patient was seen by myself as well as PA provider.  Briefly this is a 29 year old female who is on estrogen birth control hormones presenting to emergency department with bruising and swelling to her left leg.  She reports she banged her left leg near a dumpster several days ago.  She began having worsening pain around her left calf.  She also reports bruising and swelling.  She does have a sizable region of bruising on the left posterior calf.  There is a firmness in the center of this.  Does not erythematous.  I do not feel large fluctuant pocket.  Her x-ray did not show any signs of refraction.  She is pending a DVT ultrasound.   [MT]    Clinical Course User Index [MT] Terald Sleeper, MD   MDM Rules/Calculators/A&P                     29 year old female presents to the ED for evaluation of left lower extremity pain after falling in a dumpster on Sunday.  Vitals all within normal limits.  Patient is not tachycardic or hypoxic.  Patient in no acute distress and nonill appearing. Tenderness to palpation over medial aspect inferior to left knee with surrounding ecchymosis with smaller area of induration. Full range of motion of left hip, knee, and ankle.  Neurovascularly intact.  Able to ambulate in the ED without difficulty. Soft compartments. Doubt compartment syndrome. Will obtain x-ray to rule out bony fractures. Tetanus shot updated here in the  ED.  X-ray personally reviewed which is negative for bony fractures. Will obtain US to rule out DVT. Discussed case with Dr. Renaye Rakers who evaluated patient at bedside and  agrees with assessment and plan.   Ultrasound personally reviewed which is negative for DVT. Will discharge patient with naproxen. Advised patient to keep an eye on leg and to return to the ER if it becomes more red, swollen, or warm. Patient advised to follow-up with PCP within the next week to recheck leg. Strict ED precautions discussed with patient. Patient states understanding and agrees to plan. Patient discharged home in no acute distress and stable vitals  Final Clinical Impression(s) / ED Diagnoses Final diagnoses:  Injury of left lower extremity, initial encounter    Rx / DC Orders ED Discharge Orders         Ordered    naproxen (NAPROSYN) 500 MG tablet  2 times daily     01/14/20 1614           Jesusita Oka 01/14/20 1616    Terald Sleeper, MD 01/14/20 1911

## 2020-05-04 ENCOUNTER — Telehealth: Payer: Self-pay | Admitting: *Deleted

## 2020-05-04 NOTE — Telephone Encounter (Signed)
Pt called to office with question about IUD.  Return call to pt. Pt states she may be interested in getting an IUD- she having hard time remembering to take pills. Pt made aware she may call and set up a [facility] consult appt or schedule for an IUD insertion appt. Pt advised to remain on pills until she comes in for IUD placement.  If not, abstain from intercourse for 2 weeks prior to appt.

## 2020-05-27 ENCOUNTER — Other Ambulatory Visit: Payer: Self-pay

## 2020-05-27 ENCOUNTER — Emergency Department (HOSPITAL_COMMUNITY)
Admission: EM | Admit: 2020-05-27 | Discharge: 2020-05-28 | Discharge status: Against Medical Advice | Payer: Medicaid Other

## 2020-05-28 ENCOUNTER — Emergency Department (HOSPITAL_COMMUNITY)
Admission: EM | Admit: 2020-05-28 | Discharge: 2020-05-28 | Disposition: A | Discharge status: Home / Self Care | Payer: Medicaid Other | Attending: Emergency Medicine | Admitting: Emergency Medicine

## 2020-05-28 ENCOUNTER — Encounter (HOSPITAL_COMMUNITY): Payer: Self-pay

## 2020-05-28 DIAGNOSIS — L0291 Cutaneous abscess, unspecified: Secondary | ICD-10-CM

## 2020-05-28 DIAGNOSIS — L03115 Cellulitis of right lower limb: Secondary | ICD-10-CM | POA: Insufficient documentation

## 2020-05-28 DIAGNOSIS — Z87891 Personal history of nicotine dependence: Secondary | ICD-10-CM | POA: Insufficient documentation

## 2020-05-28 DIAGNOSIS — L02415 Cutaneous abscess of right lower limb: Secondary | ICD-10-CM | POA: Insufficient documentation

## 2020-05-28 MED ORDER — CEPHALEXIN 500 MG PO CAPS
500.0000 mg | ORAL_CAPSULE | Freq: Once | ORAL | Status: AC
  Administered 2020-05-28: 500 mg via ORAL
  Filled 2020-05-28: qty 1

## 2020-05-28 MED ORDER — CEPHALEXIN 500 MG PO CAPS: 500.0000 mg | ORAL_CAPSULE | Freq: Three times a day (TID) | ORAL | 0 refills | Status: AC

## 2020-05-28 MED ORDER — LIDOCAINE HCL (PF) 1 % IJ SOLN
5.0000 mL | Freq: Once | INTRAMUSCULAR | Status: DC
  Filled 2020-05-28: qty 30

## 2020-05-28 NOTE — Discharge Instructions (Addendum)
You were evaluated in the Emergency Department and after careful evaluation, we did not find any emergent condition requiring admission or further testing in the hospital.  Your exam/testing today is overall reassuring.  Your symptoms seem to be due to an abscess with surrounding skin infection.  We drained the abscess here in the emergency department, the wound may drain for the next 1 or 2 days.  Please take the antibiotics as directed, use Tylenol or Motrin for discomfort.  Please return to the Emergency Department if you experience any worsening of your condition.  We encourage you to follow up with a primary care provider.  Thank you for allowing [place] to be a part of your care.

## 2020-05-28 NOTE — ED Provider Notes (Signed)
[facility] Emergency Department Provider Note MRN:  [id number]  Arrival date & time: 05/28/20     Chief Complaint   Abscess   History of Present Illness   [name] is a 29 y.o. year-old female with no pertinent past medical history presenting to the ED with chief complaint of abscess.  Patient explains that a few days ago she accidentally banged her shin against a metal structure at work.  Since then has been having some increased pain and swelling to the area.  Denies fever, no other complaints.  Pain is mild to moderate, constant, worse with palpation.  Review of Systems  A complete 10 system review of systems was obtained and all systems are negative except as noted in the HPI and PMH.   Patient's Health History    Past Medical History:  Diagnosis Date  . Herniated disc   . Medical history non-contributory   . UTI (lower urinary tract infection)    recurrent    Past Surgical History:  Procedure Laterality Date  . NO PAST SURGERIES      Family History  Problem Relation Age of Onset  . Hypertension Mother   . Hypertension Maternal Grandmother   . Hypertension Maternal Grandfather     Social History   Socioeconomic History  . Marital status: Single    Spouse name: Not on file  . Number of children: Not on file  . Years of education: Not on file  . Highest education level: Not on file  Occupational History  . Not on file  Tobacco Use  . Smoking status: Former Smoker    Quit date: 11/09/2014    Years since quitting: 5.5  . Smokeless tobacco: Never Used  Vaping Use  . Vaping Use: Some days  Substance and Sexual Activity  . Alcohol use: Yes    Comment: occ  . Drug use: No  . Sexual activity: Yes    Birth control/protection: None  Other Topics Concern  . Not on file  Social History Narrative  . Not on file   Social Determinants of Health   Financial Resource Strain:   . Difficulty of Paying Living Expenses:   Food  Insecurity:   . Worried About Charity fundraiser in the Last Year:   . Arboriculturist in the Last Year:   Transportation Needs:   . Film/video editor (Medical):   Marland Kitchen Lack of Transportation (Non-Medical):   Physical Activity:   . Days of Exercise per Week:   . Minutes of Exercise per Session:   Stress:   . Feeling of Stress :   Social Connections:   . Frequency of Communication with Friends and Family:   . Frequency of Social Gatherings with Friends and Family:   . Attends Religious Services:   . Active Member of Clubs or Organizations:   . Attends Archivist Meetings:   Marland Kitchen Marital Status:   Intimate Partner Violence:   . Fear of Current or Ex-Partner:   . Emotionally Abused:   Marland Kitchen Physically Abused:   . Sexually Abused:      Physical Exam   Vitals:   05/28/20 0318  BP: (!) 155/106  Pulse: 88  Resp: 18  Temp: 97.8 F (36.6 C)  SpO2: 99%    CONSTITUTIONAL: Well-appearing, NAD NEURO:  Alert and oriented x 3, no focal deficits EYES:  eyes equal and reactive ENT/NECK:  no LAD, no JVD CARDIO: Regular rate, well-perfused, normal S1 and S2  PULM:  CTAB no wheezing or rhonchi GI/GU:  normal bowel sounds, non-distended, non-tender MSK/SPINE:  No gross deformities, no edema SKIN: 8 cm circular area of erythema with central fluctuance to the right lower shin PSYCH:  Appropriate speech and behavior  *Additional and/or pertinent findings included in MDM below  Diagnostic and Interventional Summary    EKG Interpretation  Date/Time:    Ventricular Rate:    PR Interval:    QRS Duration:   QT Interval:    QTC Calculation:   R Axis:     Text Interpretation:        Labs Reviewed - No data to display  No orders to display    Medications  lidocaine (PF) (XYLOCAINE) 1 % injection 5 mL (has no administration in time range)  cephALEXin (KEFLEX) capsule 500 mg (500 mg Oral Given 05/28/20 0426)     Procedures  /  Critical Care .Marland KitchenIncision and  Drainage  Date/Time: 05/28/2020 5:00 AM Performed by: Sabas Sous, MD Authorized by: Sabas Sous, MD   Consent:    Consent obtained:  Verbal   Consent given by:  Patient   Risks discussed:  Bleeding, damage to other organs, incomplete drainage, infection and pain Location:    Type:  Abscess   Size:  1.5cm   Location:  Lower extremity   Lower extremity location:  Leg   Leg location:  R lower leg Pre-procedure details:    Skin preparation:  Chloraprep Anesthesia (see MAR for exact dosages):    Anesthesia method:  Local infiltration   Local anesthetic:  Lidocaine 1% w/o epi Procedure type:    Complexity:  Complex Procedure details:    Incision types:  Cruciate   Incision depth:  Submucosal   Scalpel blade:  11   Wound management:  Probed and deloculated and irrigated with saline   Drainage:  Bloody and purulent   Drainage amount:  Moderate   Wound treatment:  Wound left open   Packing materials:  None Post-procedure details:    Patient tolerance of procedure:  Tolerated well, no immediate complications Ultrasound ED Soft Tissue  Date/Time: 05/28/2020 5:01 AM Performed by: Sabas Sous, MD Authorized by: Sabas Sous, MD   Procedure details:    Indications: localization of abscess and evaluate for cellulitis     Transverse view:  Visualized   Longitudinal view:  Visualized   Images: archived   Location:    Location: lower extremity     Side:  Right Findings:     abscess present    cellulitis present    ED Course and Medical Decision Making  I have reviewed the triage vital signs, the nursing notes, and pertinent available records from the EMR.  Listed above are laboratory and imaging tests that I personally ordered, reviewed, and interpreted and then considered in my medical decision making (see below for details).      Cellulitis with confirmation of abscess on ultrasound.  I&D as described above.  Normal vital signs, no systemic symptoms,  appropriate for discharge on antibiotics.    Elmer Sow. Pilar Plate, MD [facility] Emergency Medicine [facility] [email or web address]  Final Clinical Impressions(s) / ED Diagnoses     ICD-10-CM   1. Abscess  L02.91   2. Cellulitis of right lower extremity  L03.115     ED Discharge Orders         Ordered    cephALEXin (KEFLEX) 500 MG capsule  3 times daily  Discontinue  Reprint     05/28/20 0458           Discharge Instructions Discussed with and Provided to Patient:     Discharge Instructions     You were evaluated in the Emergency Department and after careful evaluation, we did not find any emergent condition requiring admission or further testing in the hospital.  Your exam/testing today is overall reassuring.  Your symptoms seem to be due to an abscess with surrounding skin infection.  We drained the abscess here in the emergency department, the wound may drain for the next 1 or 2 days.  Please take the antibiotics as directed, use Tylenol or Motrin for discomfort.  Please return to the Emergency Department if you experience any worsening of your condition.  We encourage you to follow up with a primary care provider.  Thank you for allowing [place] to be a part of your care.      Sabas Sous, MD 05/28/20 [phone]

## 2020-05-28 NOTE — ED Triage Notes (Signed)
Pt reports that she hit her R lower leg on something at work last week and now there appears to be an abscess in that area. Pt states that something similar happened in the past and she had to have the area lanced. Got a tetanus shot about 3 weeks ago when she fell in to a trash can.

## 2020-10-30 ENCOUNTER — Other Ambulatory Visit: Payer: Self-pay | Admitting: Obstetrics & Gynecology

## 2020-10-30 DIAGNOSIS — Z01419 Encounter for gynecological examination (general) (routine) without abnormal findings: Secondary | ICD-10-CM

## 2020-11-21 ENCOUNTER — Telehealth: Payer: Self-pay

## 2020-11-21 DIAGNOSIS — Z01419 Encounter for gynecological examination (general) (routine) without abnormal findings: Secondary | ICD-10-CM

## 2020-11-21 MED ORDER — NORGESTIM-ETH ESTRAD TRIPHASIC 0.18/0.215/0.25 MG-35 MCG PO TABS: 1.0000 | ORAL_TABLET | Freq: Every day | ORAL | 0 refills | Status: DC

## 2020-11-21 NOTE — Telephone Encounter (Signed)
S/w pt and advised that 1 refill for [facility] can be sent and she will need to schedule annual, pt agreed.

## 2020-11-25 IMAGING — US US EXTREM LOW VENOUS*L*
1 series · 13 of 24 positions shown · non-contrast
Comparison: None.

CLINICAL DATA: Pain and edema, history of injury



[Series 1: us extrem low venous*left* · 13 of 27 slices shown]
[im 1/27]
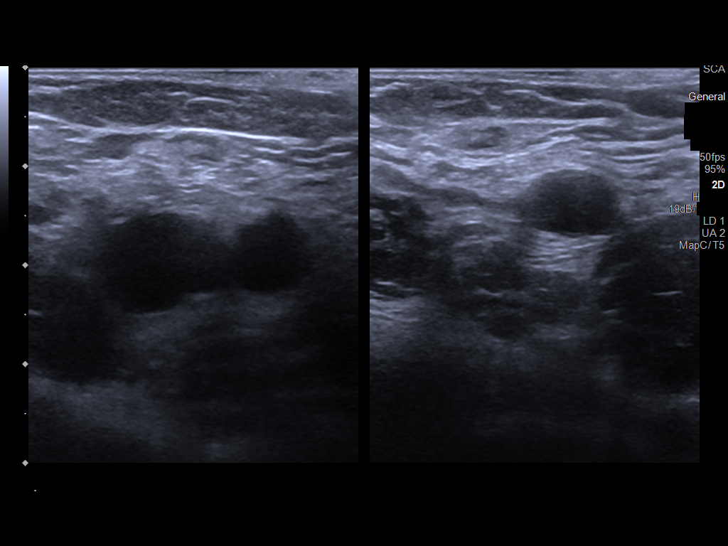
[im 3/27]
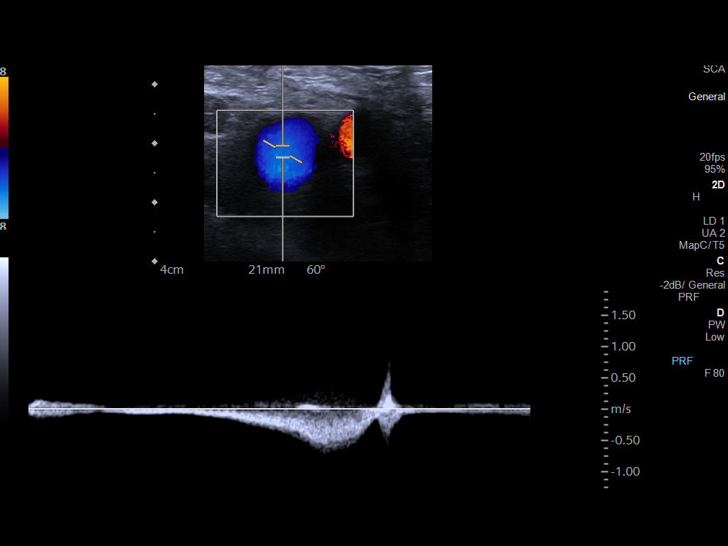
[im 5/27]
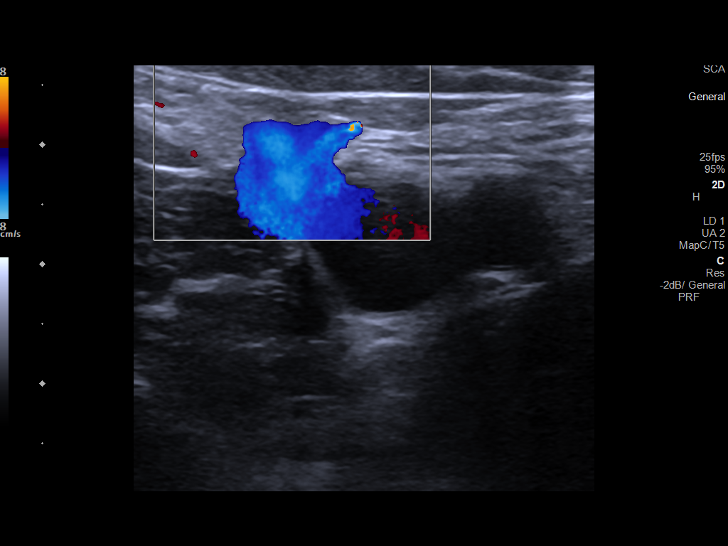
[im 7/27]
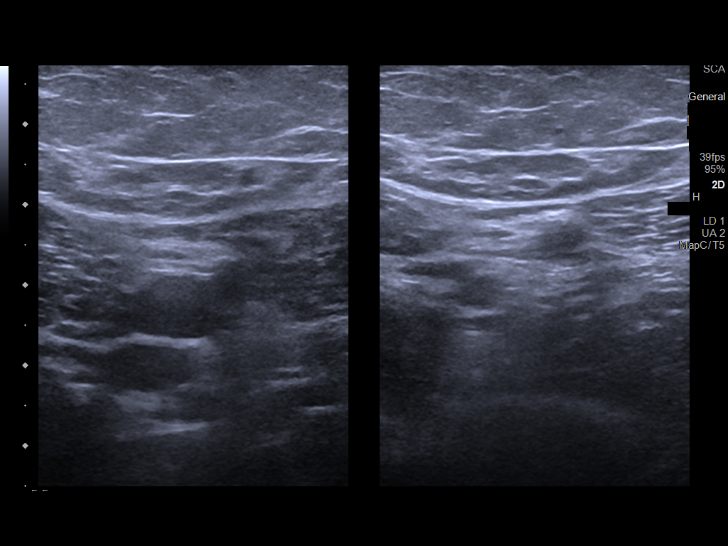
[im 10/27]
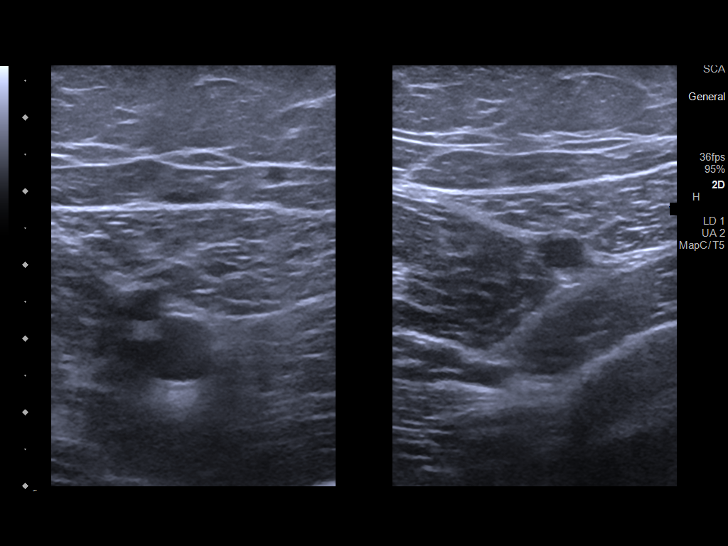
[im 12/27]
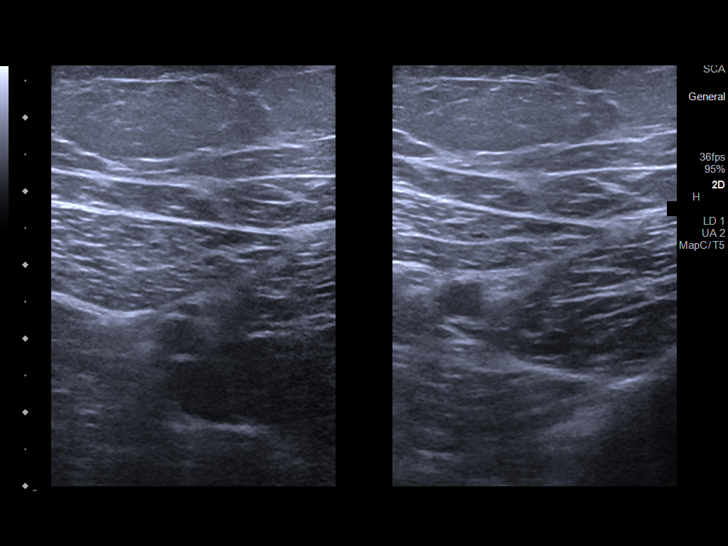
[im 14/27]
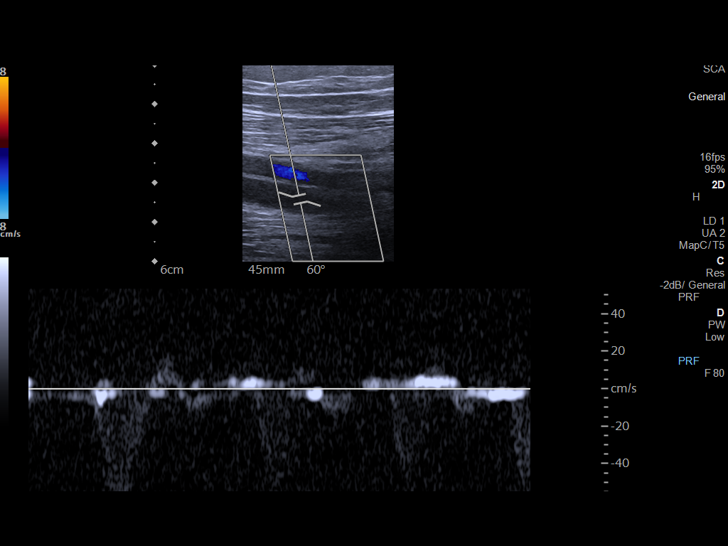
[im 15/27]
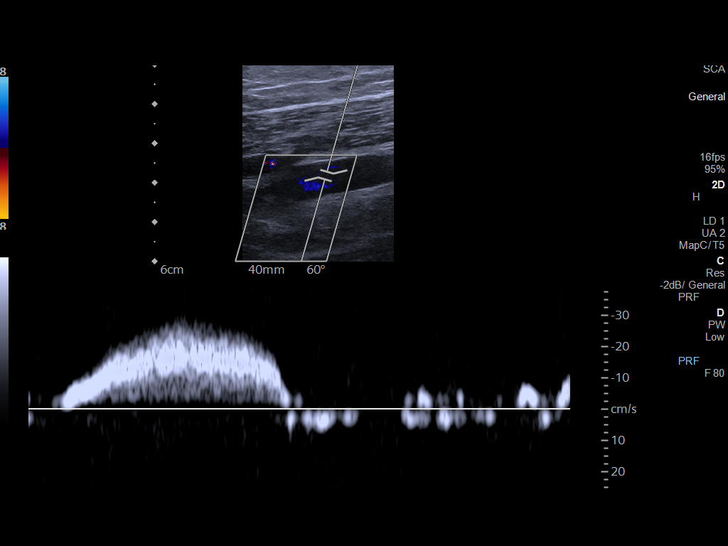
[im 17/27]
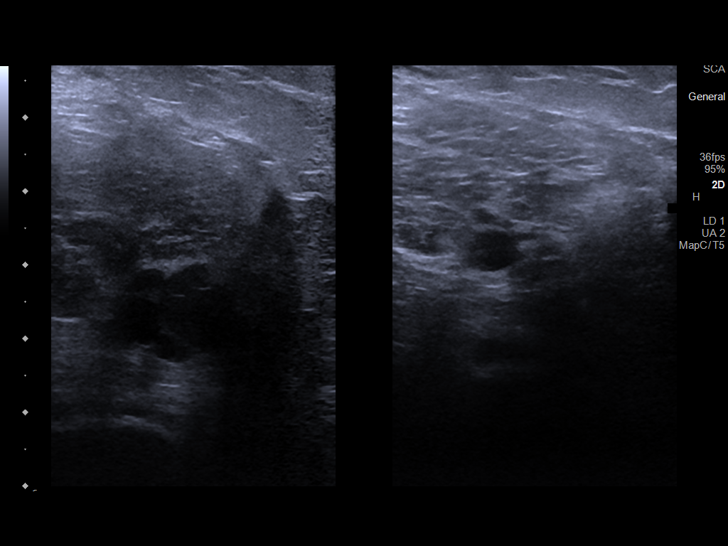
[im 20/27]
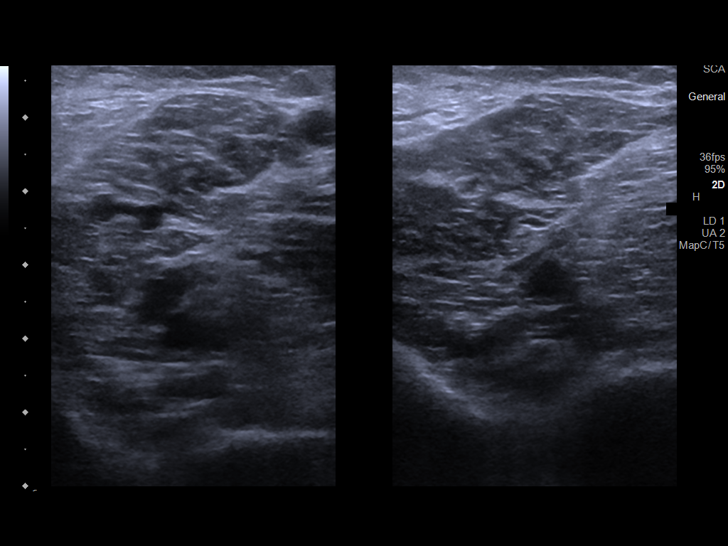
[im 22/27]
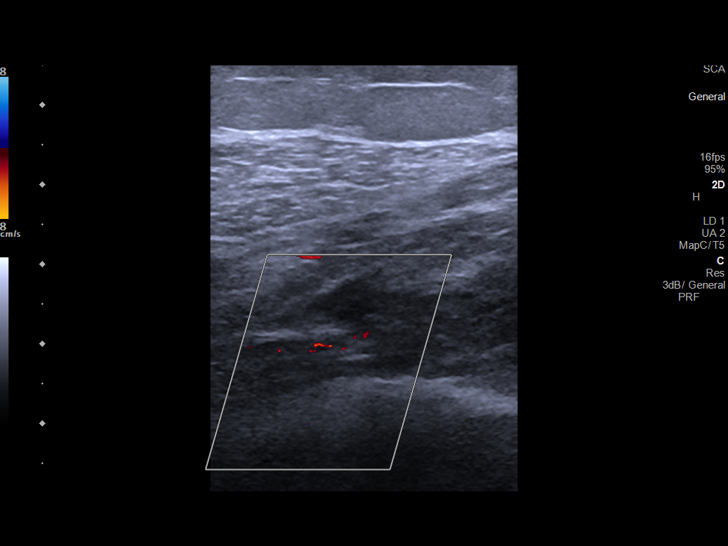
[im 24/27]
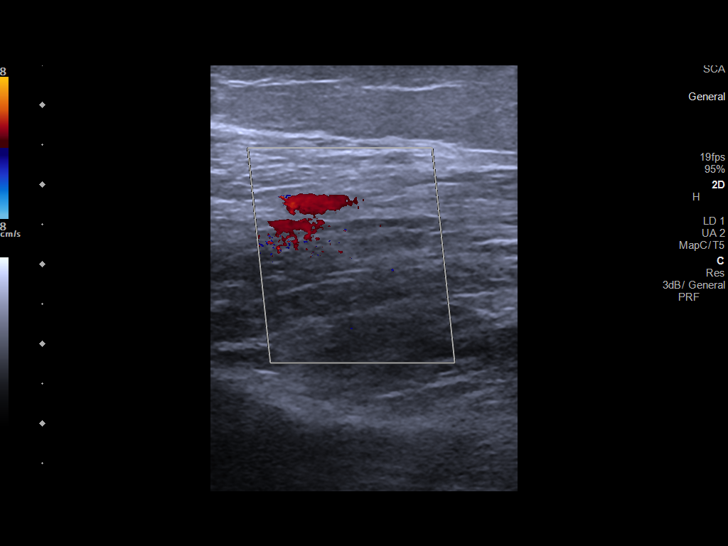
[im 27/27]
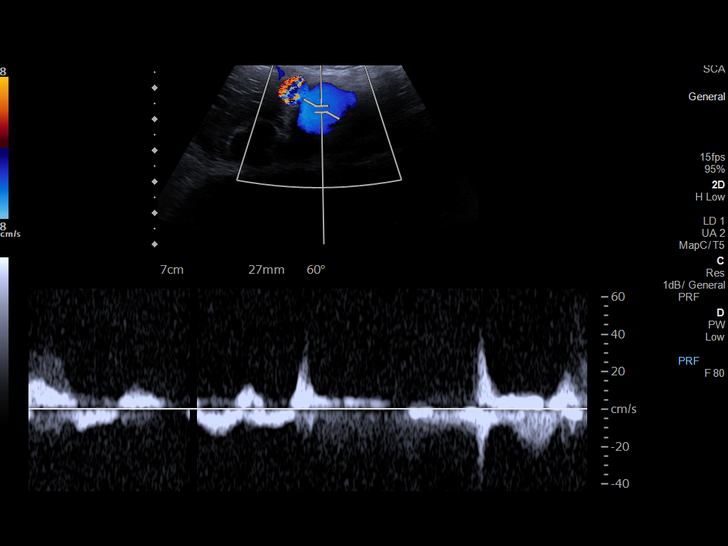

[13 of 24 positions shown; findings below may reference images not displayed]

FINDINGS: Contralateral Common Femoral Vein: Respiratory phasicity is normal
and symmetric with the symptomatic side. No evidence of thrombus.
Normal compressibility.

Common Femoral Vein: No evidence of thrombus. Normal
compressibility, respiratory phasicity and response to augmentation.

Saphenofemoral Junction: No evidence of thrombus. Normal
compressibility and flow on color Doppler imaging.

Profunda Femoral Vein: No evidence of thrombus. Normal
compressibility and flow on color Doppler imaging.

Femoral Vein: No evidence of thrombus. Normal compressibility,
respiratory phasicity and response to augmentation.

Popliteal Vein: No evidence of thrombus. Normal compressibility,
respiratory phasicity and response to augmentation.

Calf Veins: Limited visualization of the calf veins due to pain and
bruising. No gross occlusive thrombus appreciated.
IMPRESSION: No significant left lower extremity DVT. Limited assessment of the
calf veins.

## 2020-12-14 ENCOUNTER — Other Ambulatory Visit: Payer: Self-pay | Admitting: Obstetrics & Gynecology

## 2020-12-14 DIAGNOSIS — Z01419 Encounter for gynecological examination (general) (routine) without abnormal findings: Secondary | ICD-10-CM

## 2021-01-10 ENCOUNTER — Other Ambulatory Visit (HOSPITAL_COMMUNITY)
Admission: RE | Admit: 2021-01-10 | Discharge: 2021-01-10 | Disposition: A | Discharge status: Home / Self Care | Payer: Medicaid Other | Source: Ambulatory Visit | Attending: Advanced Practice Midwife | Admitting: Advanced Practice Midwife

## 2021-01-10 ENCOUNTER — Other Ambulatory Visit: Payer: Self-pay

## 2021-01-10 ENCOUNTER — Ambulatory Visit (INDEPENDENT_AMBULATORY_CARE_PROVIDER_SITE_OTHER): Payer: Medicaid Other | Admitting: Advanced Practice Midwife

## 2021-01-10 ENCOUNTER — Encounter: Payer: Self-pay | Admitting: Advanced Practice Midwife

## 2021-01-10 VITALS — BP 131/92 | HR 83 | Wt 206.4 lb

## 2021-01-10 DIAGNOSIS — Z30430 Encounter for insertion of intrauterine contraceptive device: Secondary | ICD-10-CM | POA: Diagnosis not present

## 2021-01-10 DIAGNOSIS — Z01419 Encounter for gynecological examination (general) (routine) without abnormal findings: Secondary | ICD-10-CM

## 2021-01-10 MED ORDER — LEVONORGESTREL 19.5 MCG/DAY IU IUD: INTRAUTERINE_SYSTEM | Freq: Once | INTRAUTERINE | Status: AC

## 2021-01-10 NOTE — Progress Notes (Signed)
GYNECOLOGY ANNUAL PREVENTATIVE CARE ENCOUNTER NOTE  History:     [name] is a 30 y.o. G46P1001 female here for a routine annual gynecologic exam.  Current complaints: none. Previously counseled on eligibility for IUD and desires placement today. Denies abnormal vaginal bleeding, discharge, pelvic pain, problems with intercourse or other gynecologic concerns.    Gynecologic History Patient's last menstrual period was 12/20/2020. Contraception: OCP (estrogen/progesterone) Last Pap: 12/2017. Results were: NILM   Obstetric History OB History  Gravida Para Term Preterm AB Living  1 1 1     1   SAB IAB Ectopic Multiple Live Births        0 1    # Outcome Date GA Lbr Len/2nd Weight Sex Delivery Anes PTL Lv  1 Term 09/01/15 [redacted]w[redacted]d 12:30 / 00:37 7 lb 14.1 oz (3.575 kg) F Vag-Spont EPI  LIV     Birth Comments: WNL    Past Medical History:  Diagnosis Date  . Herniated disc   . Medical history non-contributory   . UTI (lower urinary tract infection)    recurrent    Past Surgical History:  Procedure Laterality Date  . NO PAST SURGERIES      Current Outpatient Medications on File Prior to Visit  Medication Sig Dispense Refill  . etonogestrel-ethinyl estradiol (NUVARING) 0.12-0.015 MG/24HR vaginal ring Insert vaginally and leave in place for 3 consecutive weeks, then remove for 1 week. (Patient not taking: No sig reported) 1 each 12  . naproxen (NAPROSYN) 500 MG tablet Take 1 tablet (500 mg total) by mouth 2 (two) times daily. (Patient not taking: Reported on 01/10/2021) 30 tablet 0  . Norgestimate-Ethinyl Estradiol Triphasic (TRI-SPRINTEC) 0.18/0.215/0.25 MG-35 MCG tablet Take 1 tablet by mouth daily. (Patient not taking: Reported on 01/10/2021) 30 tablet 0  . ondansetron (ZOFRAN) 4 MG tablet Take 1 tablet (4 mg total) by mouth every 6 (six) hours as needed for nausea or vomiting. (Patient not taking: No sig reported) 20 tablet 0  . pantoprazole (PROTONIX) 40 MG tablet Take 1  tablet (40 mg total) by mouth daily. (Patient not taking: No sig reported) 30 tablet 3  . sucralfate (CARAFATE) 1 g tablet Take 1 tablet (1 g total) by mouth 4 (four) times daily -  with meals and at bedtime. (Patient not taking: No sig reported) 40 tablet 0  . [DISCONTINUED] norethindrone-ethinyl estradiol (JUNEL FE,GILDESS FE,LOESTRIN FE) 1-20 MG-MCG tablet Take 1 tablet by mouth daily.     No current facility-administered medications on file prior to visit.    No Known Allergies  Social History:  reports that she quit smoking about 6 years ago. She has never used smokeless tobacco. She reports current alcohol use. She reports that she does not use drugs.  Family History  Problem Relation Age of Onset  . Hypertension Mother   . Hypertension Maternal Grandmother   . Hypertension Maternal Grandfather     The following portions of the patient's history were reviewed and updated as appropriate: allergies, current medications, past family history, past medical history, past social history, past surgical history and problem list.  Review of Systems Pertinent items noted in HPI and remainder of comprehensive ROS otherwise negative.  Physical Exam:  BP (!) 131/92   Pulse 83   Wt 206 lb 6.4 oz (93.6 kg)   LMP 12/20/2020   BMI 30.48 kg/m  CONSTITUTIONAL: Well-developed, well-nourished female in no acute distress.  HENT:  Normocephalic, atraumatic, External right and left ear normal.  EYES: Conjunctivae and EOM  are normal. Pupils are equal, round, and reactive to light. No scleral icterus.  NECK: Normal range of motion, supple, no masses.  Normal thyroid.  SKIN: Skin is warm and dry. No rash noted. Not diaphoretic. No erythema. No pallor. MUSCULOSKELETAL: Normal range of motion. No tenderness.  No cyanosis, clubbing, or edema.   NEUROLOGIC: Alert and oriented to person, place, and time. Normal reflexes, muscle tone coordination.  PSYCHIATRIC: Normal mood and affect. Normal behavior.  Normal judgment and thought content. CARDIOVASCULAR: Normal heart rate noted, regular rhythm RESPIRATORY: Clear to auscultation bilaterally. Effort and breath sounds normal, no problems with respiration noted. BREASTS: Symmetric in size. No masses, tenderness, skin changes, nipple drainage, or lymphadenopathy bilaterally. Performed in the presence of a chaperone. ABDOMEN: Soft, no distention noted.  No tenderness, rebound or guarding.  PELVIC: Normal appearing external genitalia and urethral meatus; normal appearing vaginal mucosa and cervix.  No abnormal discharge noted.  Pap smear obtained.  Normal uterine size, no other palpable masses, no uterine or adnexal tenderness.  Performed in the presence of a chaperone.   Assessment and Plan:    1. Well woman exam with routine gynecological exam - No acute findings - Cytology - PAP( [place]) - Cervicovaginal ancillary only( [place])  2. Encounter for IUD insertion - See separate note.  Will follow up results of pap smear and manage accordingly. Routine preventative health maintenance measures emphasized. Please refer to After Visit Summary for other counseling recommendations.      Clayton Bibles, MSN, CNM Certified Nurse Midwife, [employer] for [employer], [facility] 01/10/21 1:45 PM

## 2021-01-10 NOTE — Patient Instructions (Signed)

## 2021-01-10 NOTE — Progress Notes (Signed)
    GYNECOLOGY OFFICE PROCEDURE NOTE  [name] is a 30 y.o. G1P1001 here for Liletta IUD insertion. No GYN concerns.  Last pap smear was on 12/2017 and was normal. See separate note for well woman appointment including pap performed today.  IUD Insertion Procedure Note Patient identified, informed consent performed, consent signed.   Discussed risks of irregular bleeding, cramping, infection, malpositioning or misplacement of the IUD outside the uterus which may require further procedure such as laparoscopy. Also discussed >99% contraception efficacy, increased risk of ectopic pregnancy with failure of method.   Emphasized that this did not protect against STIs, condoms recommended during all sexual encounters. Time out was performed.  Urine pregnancy test negative.  Speculum placed in the vagina.  Cervix visualized.  Cleaned with Betadine x 2.  Grasped anteriorly with a single tooth tenaculum.  Uterus sounded to 8 cm.   IUD placed per manufacturer's recommendations.  Strings trimmed to 3 cm. Tenaculum was removed, good hemostasis noted.  Patient tolerated procedure well.   Original IUD dislodged when faux swab was used to assess string length. Second IUD was obtained and above procedure was repeated with new sterile supplies. No issues with second placement. Patient was given Motrin and water prior to conclusion of appointment  Patient was given post-procedure instructions.  She was advised to have backup contraception for one week.  Patient was also asked to check IUD strings periodically and follow up in 4 weeks for IUD check.  Clayton Bibles, MSN, CNM Certified Nurse Midwife, [employer] for [employer], [facility] 01/10/21 1:44 PM

## 2021-01-11 ENCOUNTER — Encounter: Payer: Self-pay | Admitting: Advanced Practice Midwife

## 2021-01-11 DIAGNOSIS — A599 Trichomoniasis, unspecified: Secondary | ICD-10-CM | POA: Insufficient documentation

## 2021-01-11 LAB — CERVICOVAGINAL ANCILLARY ONLY
Bacterial Vaginitis (gardnerella): POSITIVE — AB
Candida Glabrata: NEGATIVE
Candida Vaginitis: NEGATIVE
Chlamydia: NEGATIVE
Comment: NEGATIVE
Comment: NEGATIVE
Comment: NEGATIVE
Comment: NEGATIVE
Comment: NEGATIVE
Comment: NORMAL
Neisseria Gonorrhea: NEGATIVE
Trichomonas: POSITIVE — AB

## 2021-01-12 ENCOUNTER — Other Ambulatory Visit: Payer: Self-pay

## 2021-01-12 DIAGNOSIS — A599 Trichomoniasis, unspecified: Secondary | ICD-10-CM

## 2021-01-12 MED ORDER — METRONIDAZOLE 500 MG PO TABS: ORAL_TABLET | ORAL | 0 refills | Status: DC

## 2021-01-12 NOTE — Progress Notes (Signed)
TC to pt to make aware of +BV and +Trich Tx Rx sent pt pharmacy confirmed Pt made aware to refrain from any  intercourse and partner needs tx  Pt advised to do TOC in 4-6 wkpt agreeable and voiced understanding.

## 2021-01-13 LAB — CYTOLOGY - PAP
Comment: NEGATIVE
HPV 16: NEGATIVE
HPV 18 / 45: NEGATIVE
High risk HPV: POSITIVE — AB

## 2021-01-16 ENCOUNTER — Telehealth: Payer: Self-pay | Admitting: Advanced Practice Midwife

## 2021-01-16 NOTE — Telephone Encounter (Signed)
Called patient to discuss pap results and indication for colpo. Patient states she spoke with office staff earlier today about referral for no/low cost colpo as her insurance does not cover the cost. Patient states she was told to wait for call back to schedule.  Clayton Bibles, MSN, CNM Certified Nurse Midwife, [facility] for [employer], [facility] 01/16/21 8:35 PM

## 2021-01-18 ENCOUNTER — Telehealth: Payer: Self-pay

## 2021-01-18 NOTE — Telephone Encounter (Signed)
Telephoned patient at home number. Left a message with BCCCP contact information. 

## 2021-01-24 ENCOUNTER — Ambulatory Visit: Payer: Medicaid Other

## 2021-02-07 ENCOUNTER — Ambulatory Visit: Payer: Medicaid Other | Admitting: Advanced Practice Midwife

## 2021-02-13 ENCOUNTER — Encounter: Payer: Self-pay | Admitting: Advanced Practice Midwife

## 2021-02-13 ENCOUNTER — Ambulatory Visit (INDEPENDENT_AMBULATORY_CARE_PROVIDER_SITE_OTHER): Payer: Medicaid Other | Admitting: Advanced Practice Midwife

## 2021-02-13 ENCOUNTER — Other Ambulatory Visit: Payer: Self-pay

## 2021-02-13 VITALS — BP 137/90 | HR 110 | Wt 206.2 lb

## 2021-02-13 DIAGNOSIS — Z30431 Encounter for routine checking of intrauterine contraceptive device: Secondary | ICD-10-CM

## 2021-02-13 DIAGNOSIS — R87612 Low grade squamous intraepithelial lesion on cytologic smear of cervix (LGSIL): Secondary | ICD-10-CM

## 2021-02-13 DIAGNOSIS — Z975 Presence of (intrauterine) contraceptive device: Secondary | ICD-10-CM | POA: Insufficient documentation

## 2021-02-13 NOTE — Progress Notes (Signed)
   GYNECOLOGY CLINIC PROGRESS NOTE  History:  30 y.o. G1P1001 here at [facility] today for today for IUD string check; Liletta IUD was placed  01/10/21. No complaints about the IUD, no concerning side effects.  The following portions of the patient's history were reviewed and updated as appropriate: allergies, current medications, past family history, past medical history, past social history, past surgical history and problem list. Last pap smear on 01/10/21 was abnormal, positive HRHPV.   Review of Systems:  Pertinent items are noted in HPI.   Objective:  Physical Exam Blood pressure 137/90, pulse (!) 110, weight 206 lb 3.2 oz (93.5 kg), last menstrual period 02/13/2021. Gen: NAD Abd: Soft, nontender and nondistended Pelvic: Normal appearing external genitalia; normal appearing vaginal mucosa and cervix.  IUD strings visualized, about 3 cm in length outside cervix.   Assessment & Plan:  1. Family planning, IUD (intrauterine device) check/reinsertion/removal --Patient to keep IUD in place for 5-7 years; can come in for removal if she desires pregnancy within the next 7 years. Routine preventative health maintenance measures emphasized. --Pt treated for trichomonas after last visit, completed treatment and reports she is no longer with her partner at this time.  2. Abnormal Pap --LSIL with positive HPV on 01/10/21. Colpo recommended per ASCCP guidelines.   Pt working with BCCCP to cover colposcopy, which is not covered by her [employer].   Sharen Counter, CNM 12:03 PM

## 2021-02-13 NOTE — Patient Instructions (Signed)
https://[email or web address]~/[email or web address]=z">  Colposcopy  Colposcopy is a procedure to examine the lowest part of the uterus (cervix), the vagina, and the area around the vaginal opening (vulva) for abnormalities or signs of disease. This procedure is done using an instrument that makes objects appear larger and provides light. (colposcope). During the procedure, the health care provider may remove a tissue sample to be looked at later under a microscope (biopsy). A biopsy may be done if any unusual cells are found during the colposcopy. You may have a colposcopy if you have:  An abnormal Pap smear, also called a Pap test. This screening test is used to check for signs of cancer or infection of the vagina, cervix, and uterus.  An HPV (human papillomavirus) test and get a positive result for a type of HPV that puts you at high risk of cancer.  Certain conditions or symptoms, such as: ? A sore, or lesion, on your cervix. ? Genital warts on your vulva, vagina, or cervix. ? Pain during sex. ? Vaginal bleeding, especially after sex.  A growth on your cervix (cervical polyp) that needs to be removed. Let your health care provider know about:  Any allergies you have, including allergies to medicines, latex, or iodine.  All medicines you are taking, including vitamins, herbs, eye drops, creams, and over-the-counter medicines.  Any problems you or family members have had with anesthetic medicines.  Any blood disorders you have.  Any surgeries you have had.  Any medical conditions you have, such as pelvic inflammatory disease (PID) or endometrial disorder.  The pattern of your menstrual cycles and the form of birth control (contraception) you use, if any.  Your medical history, including any history of fainting often or of cervical treatment.  Whether you are pregnant or may be pregnant. What are the risks? Generally, this is a safe  procedure. However, problems may occur, including:  Infection. Symptoms of infection may include fever, bad-smelling vaginal discharge, or pelvic pain.  Allergic reactions to medicines.  Damage to nearby structures or organs.  Fainting. This is rare. What happens before the procedure? Eating and drinking restrictions  Follow instructions from your health care provider about eating or drinking restrictions.  You will likely need to eat a regular diet the day of the procedure and not skip any meals. Tests  You may have an exam or testing. A pregnancy test will be done the day of the procedure.  You may have a blood or urine sample taken. General instructions  Ask your health care provider about: ? Changing or stopping your regular medicines. This is especially important if you are taking diabetes medicines or blood thinners. ? Taking medicines such as aspirin and ibuprofen. These medicines can thin your blood. Do not take these medicines unless your health care provider tells you to take them. Your health care provider will likely tell you to avoid taking aspirin, or medicine that contains aspirin, for 7 days before the procedure. ? Taking over-the-counter medicines, vitamins, herbs, and supplements.  Tell your health care provider if you have your menstrual period now or will have it at the time of your procedure. A colposcopy is not normally done during your menstrual period.  If you use contraception, continue to use it before your procedure.  For 24 hours before the procedure: ? Do not use douche products or tampons. ? Do not use medicines, creams, or suppositories in the vagina. ? Do not have sex.  Ask your health care provider what steps will be   taken to prevent infection. What happens during the procedure?  You will lie down on your back, with your feet in foot rests (stirrups).  A tool called a speculum will be warmed and will have oil or gel put on it (will be  lubricated). The speculum will then be inserted into your vagina. This will be used to hold apart the walls of your vagina so your health care provider can see your cervix and the inside of your vagina.  A cotton swab will be used to place a small amount of a liquid (solution) on the areas to be examined. This solution makes it easier to see abnormal cells. You may feel a slight burning during this part.  The colposcope will be used to scan the cervix with a bright white light. The colposcope will be held near your vulva and will make your vulva, vagina, and cervix look bigger so they can be seen better.  If a biopsy is needed: ? You may be given a medicine to numb the area (local anesthetic). ? Surgical tools will be used to remove mucus and cells through your vagina. ? You may feel mild pain while the tissue sample is removed. ? Bleeding may occur. A solution may be used to stop the bleeding. ? If a biopsy is needed from the inside of the cervix, a different procedure called endocervical curettage (ECC) may be done. During this procedure, a curved tool called a curette will be used to scrape cells from your cervix or the top of your cervix (endocervix).  Any abnormalities that are found will be recorded. The procedure may vary among health care providers and hospitals. What happens after the procedure?  You will lie down and rest for a few minutes. You may be offered juice or cookies.  Your blood pressure, heart rate, breathing rate, and blood oxygen level will be monitored until you leave the hospital or clinic.  You may have some cramping in your abdomen. This should go away after a few minutes.  It is up to you to get the results of your procedure. Ask your health care provider, or the department that is doing the procedure, when your results will be ready. Summary  Colposcopy is a procedure to examine the lowest part of the uterus (cervix), the vagina, and the area around the vaginal  opening (vulva) for abnormalities or signs of disease.  A biopsy may be done as part of the procedure.  After the procedure, you will remain lying down and will rest for a few minutes.  You may have some cramping in your abdomen. This should go away after a few minutes. This information is not intended to replace advice given to you by your health care provider. Make sure you discuss any questions you have with your health care provider. Document Revised: 11/25/2019 Document Reviewed: 11/25/2019 Elsevier Patient Education  2021 Elsevier Inc.  

## 2021-02-14 ENCOUNTER — Ambulatory Visit: Payer: Medicaid Other | Admitting: *Deleted

## 2021-02-14 VITALS — BP 138/82 | Wt 205.1 lb

## 2021-02-14 DIAGNOSIS — Z1239 Encounter for other screening for malignant neoplasm of breast: Secondary | ICD-10-CM

## 2021-02-14 DIAGNOSIS — R87612 Low grade squamous intraepithelial lesion on cytologic smear of cervix (LGSIL): Secondary | ICD-10-CM

## 2021-02-14 NOTE — Patient Instructions (Signed)
Explained breast self awareness with [employer]. Patient did not need a Pap smear today due to last Pap smear was 01/10/2021. Explained the colposcopy the recommended follow-up for her abnormal Pap smear. Referred patient to the [facility] for [facility] for a colposcopy to follow-up for her abnormal Pap smear. [facility] will call patient to schedule appointment. Let patient know a screening mammogram recommended at age 57 unless clinically indicated prior. [name] verbalized understanding.  Yobany Vroom, Kathaleen Maser, RN 1:52 PM

## 2021-02-14 NOTE — Progress Notes (Signed)
[name] is a 30 y.o. female who presents to [facility] clinic today with no complaints. Patient referred to [facility] by the Center for [facility] due to having an abnormal Pap smear that a colposcopy is recommended for follow-up.   Pap Smear: Pap smear not completed today. Last Pap smear was 01/10/2021 at [facility] for [facility] at [facility] clinic and was LSIL with positive HPV. Per patient has no history of an abnormal Pap smear prior to her most recent Pap smear. Last Pap smear result is available in Epic.   Physical exam: Breasts Breasts symmetrical. No skin abnormalities bilateral breasts. No nipple retraction bilateral breasts. No nipple discharge bilateral breasts. No lymphadenopathy. No lumps palpated bilateral breasts. No complaints of pain or tenderness on exam. Screening mammogram recommended at age 85 unless clinically indicated prior.      Pelvic/Bimanual Pap is not indicated today per BCCCP guidelines.   Smoking History: Patient has never smoked.   Patient Navigation: Patient education provided. Access to services provided for patient through BCCCP program.    Breast and Cervical Cancer Risk Assessment: Patient has family history of her paternal grandmother having breast cancer. Patient has no known genetic mutations or history of radiation treatment to the chest before age 14. Patient does not have history of cervical dysplasia, immunocompromised, or DES exposure in-utero.  Risk Assessment    Risk Scores      02/14/2021   Last edited by: Meryl Dare, CMA   5-year risk:    Lifetime risk:           A: BCCCP exam without pap smear No complaints.  P: Referred patient to the [facility] for [facility] for a colposcopy to follow-up for her abnormal Pap smear. [facility] will call patient to schedule appointment.  Priscille Heidelberg, RN 02/14/2021 1:51 PM

## 2021-03-03 ENCOUNTER — Emergency Department (HOSPITAL_BASED_OUTPATIENT_CLINIC_OR_DEPARTMENT_OTHER): Payer: Self-pay

## 2021-03-03 ENCOUNTER — Encounter (HOSPITAL_BASED_OUTPATIENT_CLINIC_OR_DEPARTMENT_OTHER): Payer: Self-pay

## 2021-03-03 ENCOUNTER — Other Ambulatory Visit: Payer: Self-pay

## 2021-03-03 ENCOUNTER — Emergency Department (HOSPITAL_BASED_OUTPATIENT_CLINIC_OR_DEPARTMENT_OTHER)
Admission: EM | Admit: 2021-03-03 | Discharge: 2021-03-03 | Disposition: A | Discharge status: Home / Self Care | Payer: Self-pay | Attending: Emergency Medicine | Admitting: Emergency Medicine

## 2021-03-03 DIAGNOSIS — Y92511 Restaurant or cafe as the place of occurrence of the external cause: Secondary | ICD-10-CM | POA: Insufficient documentation

## 2021-03-03 DIAGNOSIS — Y990 Civilian activity done for income or pay: Secondary | ICD-10-CM | POA: Insufficient documentation

## 2021-03-03 DIAGNOSIS — W228XXA Striking against or struck by other objects, initial encounter: Secondary | ICD-10-CM | POA: Insufficient documentation

## 2021-03-03 DIAGNOSIS — S9002XA Contusion of left ankle, initial encounter: Secondary | ICD-10-CM | POA: Insufficient documentation

## 2021-03-03 DIAGNOSIS — Z87891 Personal history of nicotine dependence: Secondary | ICD-10-CM | POA: Insufficient documentation

## 2021-03-03 NOTE — ED Provider Notes (Signed)
[facility] EMERGENCY DEPARTMENT Provider Note   CSN: [id number] Arrival date & time: 03/03/21  1813     History Chief Complaint  Patient presents with  . Ankle Pain    [name] is a 30 y.o. female.  30 year old female presents with complaint of left lateral ankle pain and swelling for the past week and a half after hitting her ankle on a piece of furniture.  Patient works at [employer], states that she has been very active and on her feet and has not had much time to rest her foot.  Patient noticed how much it was swollen the other day and has tried applying ice and elevating without significant improvement.  No other injuries, complaints, concerns.        Past Medical History:  Diagnosis Date  . Herniated disc   . Medical history non-contributory   . UTI (lower urinary tract infection)    recurrent    Patient Active Problem List   Diagnosis Date Noted  . LGSIL on Pap smear of cervix 02/13/2021  . IUD (intrauterine device) in place 02/13/2021  . Trichomonas infection 01/11/2021  . Encounter for IUD insertion 01/10/2021  . Cystitis, recurrent 03/27/2012  . LUMBAGO 03/30/2010  . KNEE PAIN, BILATERAL 03/04/2009  . PES PLANUS 03/04/2009    Past Surgical History:  Procedure Laterality Date  . NO PAST SURGERIES       OB History    Gravida  2   Para  1   Term  1   Preterm      AB      Living  1     SAB      IAB      Ectopic      Multiple  0   Live Births  1           Family History  Problem Relation Age of Onset  . Hypertension Mother   . Hypertension Maternal Grandmother   . Hypertension Maternal Grandfather   . Breast cancer Paternal Grandmother     Social History   Tobacco Use  . Smoking status: Former Smoker    Quit date: 11/09/2014    Years since quitting: 6.3  . Smokeless tobacco: Never Used  Vaping Use  . Vaping Use: Some days  Substance Use Topics  . Alcohol use: Yes    Comment: occ  . Drug use: No     Home Medications Prior to Admission medications   Medication Sig Start Date End Date Taking? Authorizing Provider  etonogestrel-ethinyl estradiol (NUVARING) 0.12-0.015 MG/24HR vaginal ring Insert vaginally and leave in place for 3 consecutive weeks, then remove for 1 week. Patient not taking: No sig reported 01/23/18   Adam Phenix, MD  metroNIDAZOLE (FLAGYL) 500 MG tablet Take two tablets by mouth twice a day, for one day.  Or you can take all four tablets at once if you can tolerate it. Patient not taking: No sig reported 01/12/21   Clayton Bibles C, CNM  naproxen (NAPROSYN) 500 MG tablet Take 1 tablet (500 mg total) by mouth 2 (two) times daily. Patient not taking: No sig reported 01/14/20   Mannie Stabile, PA-C  Norgestimate-Ethinyl Estradiol Triphasic (TRI-SPRINTEC) 0.18/0.215/0.25 MG-35 MCG tablet Take 1 tablet by mouth daily. Patient not taking: No sig reported 11/21/20   Adam Phenix, MD  ondansetron (ZOFRAN) 4 MG tablet Take 1 tablet (4 mg total) by mouth every 6 (six) hours as needed for nausea or vomiting. Patient not  taking: No sig reported 10/25/19   Gilda Crease, MD  pantoprazole (PROTONIX) 40 MG tablet Take 1 tablet (40 mg total) by mouth daily. Patient not taking: No sig reported 10/25/19   Gilda Crease, MD  sucralfate (CARAFATE) 1 g tablet Take 1 tablet (1 g total) by mouth 4 (four) times daily -  with meals and at bedtime. Patient not taking: No sig reported 10/25/19   Gilda Crease, MD  norethindrone-ethinyl estradiol (JUNEL FE,GILDESS FE,LOESTRIN FE) 1-20 MG-MCG tablet Take 1 tablet by mouth daily.  10/25/19  [provider]    Allergies    Patient has no known allergies.  Review of Systems   Review of Systems  Constitutional: Negative for fever.  Musculoskeletal: Positive for arthralgias and joint swelling.  Skin: Negative for rash and wound.  Allergic/Immunologic: Negative for immunocompromised state.   Neurological: Negative for weakness and numbness.    Physical Exam Updated Vital Signs BP (!) 154/94 (BP Location: Left Arm)   Pulse 84   Temp 98.5 F (36.9 C) (Oral)   Resp 18   Ht 5\' 9"  (1.753 m)   Wt 93 kg   LMP 02/13/2021   SpO2 98%   BMI 30.27 kg/m   Physical Exam Vitals and nursing note reviewed.  Constitutional:      General: She is not in acute distress.    Appearance: She is well-developed. She is not diaphoretic.  HENT:     Head: Normocephalic and atraumatic.  Cardiovascular:     Pulses: Normal pulses.  Pulmonary:     Effort: Pulmonary effort is normal.  Musculoskeletal:        General: Swelling and tenderness present. No deformity.       Legs:  Skin:    General: Skin is warm and dry.     Findings: Bruising present. No erythema.  Neurological:     Mental Status: She is alert and oriented to person, place, and time.     Sensory: No sensory deficit.     Motor: No weakness.  Psychiatric:        Behavior: Behavior normal.     ED Results / Procedures / Treatments   Labs (all labs ordered are listed, but only abnormal results are displayed) Labs Reviewed - No data to display  EKG None  Radiology DG Ankle Complete Left  Result Date: 03/03/2021 CLINICAL DATA:  Swelling and discoloration of the ankle. EXAM: LEFT ANKLE COMPLETE - 3+ VIEW COMPARISON:  None. FINDINGS: Soft tissue swelling, particularly lateral. No visible fracture. No degenerative change or other bone finding elsewhere. IMPRESSION: Soft tissue swelling, particularly lateral. No bone or joint finding. Electronically Signed   By: 03/05/2021 M.D.   On: 03/03/2021 18:54    Procedures Procedures   Medications Ordered in ED Medications - No data to display  ED Course  I have reviewed the triage vital signs and the nursing notes.  Pertinent labs & imaging results that were available during my care of the patient were reviewed by me and considered in my medical decision making (see chart  for details).  Clinical Course as of 03/03/21 1904  03/05/21 Mar 03, 2021  [id number] 30 year old female with pain, swelling to her left ankle after an injury a week and a half ago.  X-ray shows soft tissue swelling without bony injury.  Patient will be placed in a cam boot and referred to sports medicine for follow-up, advised to alternate warm and cold compresses and elevate the leg to help  reduce swelling.  Can also take Aleve or Tylenol as needed as directed for pain. [LM]    Clinical Course User Index [LM] Alden Hipp   MDM Rules/Calculators/A&P                          Final Clinical Impression(s) / ED Diagnoses Final diagnoses:  Contusion of left ankle, initial encounter    Rx / DC Orders ED Discharge Orders    None       Alden Hipp 03/03/21 1904    Terrilee Files, MD 03/04/21 [phone]

## 2021-03-03 NOTE — Discharge Instructions (Addendum)
Use boot as needed to protect and support the ankle.  Alternate warm and cold compresses for 20 minutes at a time each and elevate the leg as discussed.  Take Aleve or Tylenol as needed as directed.  Follow-up with sports medicine if pain is not improving.

## 2021-03-03 NOTE — ED Triage Notes (Signed)
Pt reports swelling and discoloration to felt ankle. Noticed Saturday or Sunday.

## 2021-03-07 ENCOUNTER — Encounter: Payer: Medicaid Other | Admitting: Obstetrics & Gynecology

## 2021-03-27 ENCOUNTER — Encounter: Payer: Medicaid Other | Admitting: Obstetrics and Gynecology

## 2021-04-12 ENCOUNTER — Other Ambulatory Visit (HOSPITAL_COMMUNITY)
Admission: RE | Admit: 2021-04-12 | Discharge: 2021-04-12 | Disposition: A | Discharge status: Home / Self Care | Payer: Medicaid Other | Source: Ambulatory Visit | Attending: Obstetrics & Gynecology | Admitting: Obstetrics & Gynecology

## 2021-04-12 ENCOUNTER — Ambulatory Visit (INDEPENDENT_AMBULATORY_CARE_PROVIDER_SITE_OTHER): Payer: Self-pay | Admitting: Obstetrics

## 2021-04-12 ENCOUNTER — Other Ambulatory Visit: Payer: Self-pay

## 2021-04-12 VITALS — BP 121/72 | HR 83 | Ht 69.0 in | Wt 198.0 lb

## 2021-04-12 DIAGNOSIS — R87612 Low grade squamous intraepithelial lesion on cytologic smear of cervix (LGSIL): Secondary | ICD-10-CM | POA: Diagnosis not present

## 2021-04-12 DIAGNOSIS — Z01812 Encounter for preprocedural laboratory examination: Secondary | ICD-10-CM

## 2021-04-12 LAB — POCT URINE PREGNANCY: Preg Test, Ur: NEGATIVE

## 2021-04-12 NOTE — Progress Notes (Signed)
GYN presents for COLPO  PAP LSIL.   UPT today is NEGATIVE

## 2021-04-12 NOTE — Progress Notes (Signed)
Colposcopy Procedure Note  Indications: Pap smear 2 months ago showed: low-grade squamous intraepithelial neoplasia (LGSIL - encompassing HPV,mild dysplasia,CIN I). The prior pap showed low-grade squamous intraepithelial neoplasia (LGSIL - encompassing HPV,mild dysplasia,CIN I).  Prior cervical/vaginal disease: normal exam without visible pathology. Prior cervical treatment: no treatment.  Procedure Details  The risks and benefits of the procedure and Written informed consent obtained.  A time-out was performed confirming the patient, procedure and allergy status  Speculum placed in vagina and excellent visualization of cervix achieved, cervix swabbed x 3 with acetic acid solution.  Findings: Cervix: no visible lesions; SCJ visualized 360 degrees without lesions, endocervical curettage performed, cervical biopsies taken at 6 and 12 o'clock, specimen labelled and sent to pathology and hemostasis achieved with silver nitrate.   Vaginal inspection: normal without visible lesions. Vulvar colposcopy: vulvar colposcopy not performed.   Physical Exam   Specimens: ECC and cervical biopsies  Complications: none.  Plan: Specimens labelled and sent to Pathology. Will base further treatment on Pathology findings. Treatment options discussed with patient. Post biopsy instructions given to patient. Return to discuss Pathology results in 2 weeks.   Brock Bad, MD 04/12/2021 5:00 PM

## 2021-04-12 NOTE — Patient Instructions (Signed)
[email or web address]  Colposcopy  Colposcopy is a procedure to examine the lowest part of the uterus (cervix), the vagina, and the area around the vaginal opening (vulva) for abnormalities or signs of disease. This procedure is done using an instrument that makes objects appear larger and provides light. (colposcope). During the procedure, the health care provider may remove a tissue sample to be looked at later under a microscope (biopsy). A biopsy may be done if any unusual cells are found during the colposcopy. You may have a colposcopy if you have:  An abnormal Pap smear, also called a Pap test. This screening test is used to check for signs of cancer or infection of the vagina, cervix, and uterus.  An HPV (human papillomavirus) test and get a positive result for a type of HPV that puts you at high risk of cancer.  Certain conditions or symptoms, such as: ? A sore, or lesion, on your cervix. ? Genital warts on your vulva, vagina, or cervix. ? Pain during sex. ? Vaginal bleeding, especially after sex.  A growth on your cervix (cervical polyp) that needs to be removed. Let your health care provider know about:  Any allergies you have, including allergies to medicines, latex, or iodine.  All medicines you are taking, including vitamins, herbs, eye drops, creams, and over-the-counter medicines.  Any problems you or family members have had with anesthetic medicines.  Any blood disorders you have.  Any surgeries you have had.  Any medical conditions you have, such as pelvic inflammatory disease (PID) or endometrial disorder.  The pattern of your menstrual cycles and the form of birth control (contraception) you use, if any.  Your medical history, including any history of fainting often or of cervical treatment.  Whether you are pregnant or may be pregnant. What are the risks? Generally, this is a safe  procedure. However, problems may occur, including:  Infection. Symptoms of infection may include fever, bad-smelling vaginal discharge, or pelvic pain.  Allergic reactions to medicines.  Damage to nearby structures or organs.  Fainting. This is rare. What happens before the procedure? Eating and drinking restrictions  Follow instructions from your health care provider about eating or drinking restrictions.  You will likely need to eat a regular diet the day of the procedure and not skip any meals. Tests  You may have an exam or testing. A pregnancy test will be done the day of the procedure.  You may have a blood or urine sample taken. General instructions  Ask your health care provider about: ? Changing or stopping your regular medicines. This is especially important if you are taking diabetes medicines or blood thinners. ? Taking medicines such as aspirin and ibuprofen. These medicines can thin your blood. Do not take these medicines unless your health care provider tells you to take them. Your health care provider will likely tell you to avoid taking aspirin, or medicine that contains aspirin, for 7 days before the procedure. ? Taking over-the-counter medicines, vitamins, herbs, and supplements.  Tell your health care provider if you have your menstrual period now or will have it at the time of your procedure. A colposcopy is not normally done during your menstrual period.  If you use contraception, continue to use it before your procedure.  For 24 hours before the procedure: ? Do not use douche products or tampons. ? Do not use medicines, creams, or suppositories in the vagina. ? Do not have sex.  Ask your health care provider what steps will be   taken to prevent infection. What happens during the procedure?  You will lie down on your back, with your feet in foot rests (stirrups).  A tool called a speculum will be warmed and will have oil or gel put on it (will be  lubricated). The speculum will then be inserted into your vagina. This will be used to hold apart the walls of your vagina so your health care provider can see your cervix and the inside of your vagina.  A cotton swab will be used to place a small amount of a liquid (solution) on the areas to be examined. This solution makes it easier to see abnormal cells. You may feel a slight burning during this part.  The colposcope will be used to scan the cervix with a bright white light. The colposcope will be held near your vulva and will make your vulva, vagina, and cervix look bigger so they can be seen better.  If a biopsy is needed: ? You may be given a medicine to numb the area (local anesthetic). ? Surgical tools will be used to remove mucus and cells through your vagina. ? You may feel mild pain while the tissue sample is removed. ? Bleeding may occur. A solution may be used to stop the bleeding. ? If a biopsy is needed from the inside of the cervix, a different procedure called endocervical curettage (ECC) may be done. During this procedure, a curved tool called a curette will be used to scrape cells from your cervix or the top of your cervix (endocervix).  Any abnormalities that are found will be recorded. The procedure may vary among health care providers and hospitals. What happens after the procedure?  You will lie down and rest for a few minutes. You may be offered juice or cookies.  Your blood pressure, heart rate, breathing rate, and blood oxygen level will be monitored until you leave the hospital or clinic.  You may have some cramping in your abdomen. This should go away after a few minutes.  It is up to you to get the results of your procedure. Ask your health care provider, or the department that is doing the procedure, when your results will be ready. Summary  Colposcopy is a procedure to examine the lowest part of the uterus (cervix), the vagina, and the area around the vaginal  opening (vulva) for abnormalities or signs of disease.  A biopsy may be done as part of the procedure.  After the procedure, you will remain lying down and will rest for a few minutes.  You may have some cramping in your abdomen. This should go away after a few minutes. This information is not intended to replace advice given to you by your health care provider. Make sure you discuss any questions you have with your health care provider. Document Revised: 11/25/2019 Document Reviewed: 11/25/2019 Elsevier Patient Education  2021 [employer]  Colpocleisis, Care After The following information offers guidance on how to care for yourself after your procedure. Your health care provider may also give you more specific instructions. If you have problems or questions, contact your health care provider. What can I expect after the procedure? After the procedure, it is common to have:  Discomfort in the vagina. This can be relieved by pain medicine.  Spotting of blood from the vagina. Follow these instructions at home: Medicines  Take over-the-counter and prescription medicines only as told by your health care provider.  If you were prescribed an antibiotic medicine, use  it as told by your health care provider. Do not stop using the antibiotic even if you start to feel better.  Ask your health care provider if the medicine prescribed to you: ? Requires you to avoid driving or using machinery. ? Can cause constipation. You may need to take these actions to prevent or treat constipation:  Drink enough fluid to keep your urine pale yellow.  Take over-the-counter or prescription medicines.  Eat foods that are high in fiber, such as beans, whole grains, and fresh fruits and vegetables.  Limit foods that are high in fat and processed sugars, such as fried or sweet foods. Managing pain and discomfort Your health care provider may say that you can take warm sitz baths 2-3 times a day to reduce  swelling and discomfort. Make sure to check with your health care provider before starting sitz baths. If you are going to take a sitz bath:  Fill the bathtub halfway with warm water.  Sit in the water and open the drain a little.  Turn on the warm water to keep the tub half full. Keep the water running constantly.  Soak in the water for 15-20 minutes.  After the sitz bath, pat the affected area dry first.   Activity  Rest as told by your health care provider. ? Avoid sitting for a long time without moving. Get up to take short walks every 1-2 hours. This is important to improve blood flow and breathing. Ask for help if you feel weak or unsteady.  Do not lift anything that is heavier than 5 lb (2.3 kg), or the limit that you are told, until your health care provider says that it is safe.  Return to your normal activities as told by your health care provider. Ask your health care provider what activities are safe for you.   General instructions  Do not swim or use a hot tub until your health care provider approves. Ask your health care provider if you may take showers.  Follow instructions from your health care provider about eating or drinking restrictions.  Do not use any products that contain nicotine or tobacco. These products include cigarettes, chewing tobacco, and vaping devices, such as e-cigarettes. These can delay incision healing after surgery. If you need help quitting, ask your health care provider.  Wear compression stockings as told by your health care provider. These stockings help to prevent blood clots and reduce swelling in your legs.  Your health care provider will want to make sure that you can empty your bladder. You might need to use a small, thin tube (catheter) to keep your bladder empty for a short time after surgery. Follow instructions from your health care provider about how to care for the catheter.  Keep all follow-up visits. This is important. Contact a  health care provider if you:  Have a fever.  Have pus or a bad smell coming from your vagina.  Have blood or pain when you urinate, or you have pain in your abdomen.  Have heavy bleeding from your vagina.  Develop a rash.  Think the sutures in your incision are breaking.  Have nausea, diarrhea, or constipation. Get help right away if you:  Develop shortness of breath.  Develop leg or chest pain.  Faint. These symptoms may represent a serious problem that is an emergency. Do not wait to see if the symptoms will go away. Get medical help right away. Call your local emergency services (911 in the [place] Do  not drive yourself to the hospital. Summary  After the procedure, it is common to have discomfort in the vagina. This can be relieved by pain medicine.  You may take warm sitz baths 2-3 times a day to reduce swelling and discomfort.  Do not lift anything that is heavier than 5 lb (2.3 kg), or the limit that you are told, until your health care provider says that it is safe.  Follow instructions from your health care provider about rest, general activities, driving, and eating and drinking. This information is not intended to replace advice given to you by your health care provider. Make sure you discuss any questions you have with your health care provider. Document Revised: 07/09/2020 Document Reviewed: 07/09/2020 Elsevier Patient Education  2021 [employer] Inc[employer]  Pap Test Why am I having this test? A Pap test, also called a Pap smear, is a screening test to check for signs of:  Cancer of the vagina, cervix, and uterus. The cervix is the lower part of the uterus that opens into the vagina.  Infection.  Changes that may be a sign that cancer is developing (precancerous changes). Women need this test on a regular basis. In general, you should have a Pap test every 3 years until you reach menopause or age 43. Women aged 30-60 may choose to have their Pap test done at the same  time as an HPV (human papillomavirus) test every 5 years (instead of every 3 years). Your health care provider may recommend having Pap tests more or less often depending on your medical conditions and past Pap test results. What kind of sample is taken? Your health care provider will collect a sample of cells from the surface of your cervix. This will be done using a small cotton swab, plastic spatula, or brush. This sample is often collected during a pelvic exam, when you are lying on your back on an exam table with feet in footrests (stirrups). In some cases, fluids (secretions) from the cervix or vagina may also be collected.   How do I prepare for this test?  Be aware of where you are in your menstrual cycle. If you are menstruating on the day of the test, you may be asked to reschedule.  You may need to reschedule if you have a known vaginal infection on the day of the test.  Follow instructions from your health care provider about: ? Changing or stopping your regular medicines. Some medicines can cause abnormal test results, such as digitalis and tetracycline. ? Avoiding douching or taking a bath the day before or the day of the test. Tell a health care provider about:  Any allergies you have.  All medicines you are taking, including vitamins, herbs, eye drops, creams, and over-the-counter medicines.  Any blood disorders you have.  Any surgeries you have had.  Any medical conditions you have.  Whether you are pregnant or may be pregnant. How are the results reported? Your test results will be reported as either abnormal or normal. A false-positive result can occur. A false positive is incorrect because it means that a condition is present when it is not. A false-negative result can occur. A false negative is incorrect because it means that a condition is not present when it is. What do the results mean? A normal test result means that you do not have signs of cancer of the  vagina, cervix, or uterus. An abnormal result may mean that you have:  Cancer. A Pap test by itself  is not enough to diagnose cancer. You will have more tests done in this case.  Precancerous changes in your vagina, cervix, or uterus.  Inflammation of the cervix.  An STD (sexually transmitted disease).  A fungal infection.  A parasite infection. Talk with your health care provider about what your results mean. Questions to ask your health care provider Ask your health care provider, or the department that is doing the test:  When will my results be ready?  How will I get my results?  What are my treatment options?  What other tests do I need?  What are my next steps? Summary  In general, women should have a Pap test every 3 years until they reach menopause or age 56.  Your health care provider will collect a sample of cells from the surface of your cervix. This will be done using a small cotton swab, plastic spatula, or brush.  In some cases, fluids (secretions) from the cervix or vagina may also be collected. This information is not intended to replace advice given to you by your health care provider. Make sure you discuss any questions you have with your health care provider. Document Revised: 08/03/2020 Document Reviewed: 07/29/2020 Elsevier Patient Education  2021 [employer] Inc.  [email or web address]  Colposcopy  Colposcopy is a procedure to examine the lowest part of the uterus (cervix), the vagina, and the area around the vaginal opening (vulva) for abnormalities or signs of disease. This procedure is done using an instrument that makes objects appear larger and provides light. (colposcope). During the procedure, the health care provider may remove a tissue sample to be looked at later under a microscope (biopsy). A biopsy may be done if any unusual cells are found during the colposcopy. You may  have a colposcopy if you have:  An abnormal Pap smear, also called a Pap test. This screening test is used to check for signs of cancer or infection of the vagina, cervix, and uterus.  An HPV (human papillomavirus) test and get a positive result for a type of HPV that puts you at high risk of cancer.  Certain conditions or symptoms, such as: ? A sore, or lesion, on your cervix. ? Genital warts on your vulva, vagina, or cervix. ? Pain during sex. ? Vaginal bleeding, especially after sex.  A growth on your cervix (cervical polyp) that needs to be removed. Let your health care provider know about:  Any allergies you have, including allergies to medicines, latex, or iodine.  All medicines you are taking, including vitamins, herbs, eye drops, creams, and over-the-counter medicines.  Any problems you or family members have had with anesthetic medicines.  Any blood disorders you have.  Any surgeries you have had.  Any medical conditions you have, such as pelvic inflammatory disease (PID) or endometrial disorder.  The pattern of your menstrual cycles and the form of birth control (contraception) you use, if any.  Your medical history, including any history of fainting often or of cervical treatment.  Whether you are pregnant or may be pregnant. What are the risks? Generally, this is a safe procedure. However, problems may occur, including:  Infection. Symptoms of infection may include fever, bad-smelling vaginal discharge, or pelvic pain.  Allergic reactions to medicines.  Damage to nearby structures or organs.  Fainting. This is rare. What happens before the procedure? Eating and drinking restrictions  Follow instructions from your health care provider about eating or drinking restrictions.  You will likely need to eat a regular diet  the day of the procedure and not skip any meals. Tests  You may have an exam or testing. A pregnancy test will be done the day of the  procedure.  You may have a blood or urine sample taken. General instructions  Ask your health care provider about: ? Changing or stopping your regular medicines. This is especially important if you are taking diabetes medicines or blood thinners. ? Taking medicines such as aspirin and ibuprofen. These medicines can thin your blood. Do not take these medicines unless your health care provider tells you to take them. Your health care provider will likely tell you to avoid taking aspirin, or medicine that contains aspirin, for 7 days before the procedure. ? Taking over-the-counter medicines, vitamins, herbs, and supplements.  Tell your health care provider if you have your menstrual period now or will have it at the time of your procedure. A colposcopy is not normally done during your menstrual period.  If you use contraception, continue to use it before your procedure.  For 24 hours before the procedure: ? Do not use douche products or tampons. ? Do not use medicines, creams, or suppositories in the vagina. ? Do not have sex.  Ask your health care provider what steps will be taken to prevent infection. What happens during the procedure?  You will lie down on your back, with your feet in foot rests (stirrups).  A tool called a speculum will be warmed and will have oil or gel put on it (will be lubricated). The speculum will then be inserted into your vagina. This will be used to hold apart the walls of your vagina so your health care provider can see your cervix and the inside of your vagina.  A cotton swab will be used to place a small amount of a liquid (solution) on the areas to be examined. This solution makes it easier to see abnormal cells. You may feel a slight burning during this part.  The colposcope will be used to scan the cervix with a bright white light. The colposcope will be held near your vulva and will make your vulva, vagina, and cervix look bigger so they can be seen  better.  If a biopsy is needed: ? You may be given a medicine to numb the area (local anesthetic). ? Surgical tools will be used to remove mucus and cells through your vagina. ? You may feel mild pain while the tissue sample is removed. ? Bleeding may occur. A solution may be used to stop the bleeding. ? If a biopsy is needed from the inside of the cervix, a different procedure called endocervical curettage (ECC) may be done. During this procedure, a curved tool called a curette will be used to scrape cells from your cervix or the top of your cervix (endocervix).  Any abnormalities that are found will be recorded. The procedure may vary among health care providers and hospitals. What happens after the procedure?  You will lie down and rest for a few minutes. You may be offered juice or cookies.  Your blood pressure, heart rate, breathing rate, and blood oxygen level will be monitored until you leave the hospital or clinic.  You may have some cramping in your abdomen. This should go away after a few minutes.  It is up to you to get the results of your procedure. Ask your health care provider, or the department that is doing the procedure, when your results will be ready. Summary  Colposcopy is a  procedure to examine the lowest part of the uterus (cervix), the vagina, and the area around the vaginal opening (vulva) for abnormalities or signs of disease.  A biopsy may be done as part of the procedure.  After the procedure, you will remain lying down and will rest for a few minutes.  You may have some cramping in your abdomen. This should go away after a few minutes. This information is not intended to replace advice given to you by your health care provider. Make sure you discuss any questions you have with your health care provider. Document Revised: 11/25/2019 Document Reviewed: 11/25/2019 Elsevier Patient Education  2021 [employer] Inc[employer]  Pap Test Why am I having this test? A Pap  test, also called a Pap smear, is a screening test to check for signs of:  Cancer of the vagina, cervix, and uterus. The cervix is the lower part of the uterus that opens into the vagina.  Infection.  Changes that may be a sign that cancer is developing (precancerous changes). Women need this test on a regular basis. In general, you should have a Pap test every 3 years until you reach menopause or age 39. Women aged 30-60 may choose to have their Pap test done at the same time as an HPV (human papillomavirus) test every 5 years (instead of every 3 years). Your health care provider may recommend having Pap tests more or less often depending on your medical conditions and past Pap test results. What kind of sample is taken? Your health care provider will collect a sample of cells from the surface of your cervix. This will be done using a small cotton swab, plastic spatula, or brush. This sample is often collected during a pelvic exam, when you are lying on your back on an exam table with feet in footrests (stirrups). In some cases, fluids (secretions) from the cervix or vagina may also be collected.   How do I prepare for this test?  Be aware of where you are in your menstrual cycle. If you are menstruating on the day of the test, you may be asked to reschedule.  You may need to reschedule if you have a known vaginal infection on the day of the test.  Follow instructions from your health care provider about: ? Changing or stopping your regular medicines. Some medicines can cause abnormal test results, such as digitalis and tetracycline. ? Avoiding douching or taking a bath the day before or the day of the test. Tell a health care provider about:  Any allergies you have.  All medicines you are taking, including vitamins, herbs, eye drops, creams, and over-the-counter medicines.  Any blood disorders you have.  Any surgeries you have had.  Any medical conditions you have.  Whether you are  pregnant or may be pregnant. How are the results reported? Your test results will be reported as either abnormal or normal. A false-positive result can occur. A false positive is incorrect because it means that a condition is present when it is not. A false-negative result can occur. A false negative is incorrect because it means that a condition is not present when it is. What do the results mean? A normal test result means that you do not have signs of cancer of the vagina, cervix, or uterus. An abnormal result may mean that you have:  Cancer. A Pap test by itself is not enough to diagnose cancer. You will have more tests done in this case.  Precancerous changes in your  vagina, cervix, or uterus.  Inflammation of the cervix.  An STD (sexually transmitted disease).  A fungal infection.  A parasite infection. Talk with your health care provider about what your results mean. Questions to ask your health care provider Ask your health care provider, or the department that is doing the test:  When will my results be ready?  How will I get my results?  What are my treatment options?  What other tests do I need?  What are my next steps? Summary  In general, women should have a Pap test every 3 years until they reach menopause or age 50.  Your health care provider will collect a sample of cells from the surface of your cervix. This will be done using a small cotton swab, plastic spatula, or brush.  In some cases, fluids (secretions) from the cervix or vagina may also be collected. This information is not intended to replace advice given to you by your health care provider. Make sure you discuss any questions you have with your health care provider. Document Revised: 08/03/2020 Document Reviewed: 07/29/2020 Elsevier Patient Education  2021 Elsevier Inc.  [email or web address]">  Colposcopy  Colposcopy is a  procedure to examine the lowest part of the uterus (cervix), the vagina, and the area around the vaginal opening (vulva) for abnormalities or signs of disease. This procedure is done using an instrument that makes objects appear larger and provides light. (colposcope). During the procedure, the health care provider may remove a tissue sample to be looked at later under a microscope (biopsy). A biopsy may be done if any unusual cells are found during the colposcopy. You may have a colposcopy if you have:  An abnormal Pap smear, also called a Pap test. This screening test is used to check for signs of cancer or infection of the vagina, cervix, and uterus.  An HPV (human papillomavirus) test and get a positive result for a type of HPV that puts you at high risk of cancer.  Certain conditions or symptoms, such as: ? A sore, or lesion, on your cervix. ? Genital warts on your vulva, vagina, or cervix. ? Pain during sex. ? Vaginal bleeding, especially after sex.  A growth on your cervix (cervical polyp) that needs to be removed. Let your health care provider know about:  Any allergies you have, including allergies to medicines, latex, or iodine.  All medicines you are taking, including vitamins, herbs, eye drops, creams, and over-the-counter medicines.  Any problems you or family members have had with anesthetic medicines.  Any blood disorders you have.  Any surgeries you have had.  Any medical conditions you have, such as pelvic inflammatory disease (PID) or endometrial disorder.  The pattern of your menstrual cycles and the form of birth control (contraception) you use, if any.  Your medical history, including any history of fainting often or of cervical treatment.  Whether you are pregnant or may be pregnant. What are the risks? Generally, this is a safe procedure. However, problems may occur, including:  Infection. Symptoms of infection may include fever, bad-smelling vaginal  discharge, or pelvic pain.  Allergic reactions to medicines.  Damage to nearby structures or organs.  Fainting. This is rare. What happens before the procedure? Eating and drinking restrictions  Follow instructions from your health care provider about eating or drinking restrictions.  You will likely need to eat a regular diet the day of the procedure and not skip any meals. Tests  You may have an exam or testing.  A pregnancy test will be done the day of the procedure.  You may have a blood or urine sample taken. General instructions  Ask your health care provider about: ? Changing or stopping your regular medicines. This is especially important if you are taking diabetes medicines or blood thinners. ? Taking medicines such as aspirin and ibuprofen. These medicines can thin your blood. Do not take these medicines unless your health care provider tells you to take them. Your health care provider will likely tell you to avoid taking aspirin, or medicine that contains aspirin, for 7 days before the procedure. ? Taking over-the-counter medicines, vitamins, herbs, and supplements.  Tell your health care provider if you have your menstrual period now or will have it at the time of your procedure. A colposcopy is not normally done during your menstrual period.  If you use contraception, continue to use it before your procedure.  For 24 hours before the procedure: ? Do not use douche products or tampons. ? Do not use medicines, creams, or suppositories in the vagina. ? Do not have sex.  Ask your health care provider what steps will be taken to prevent infection. What happens during the procedure?  You will lie down on your back, with your feet in foot rests (stirrups).  A tool called a speculum will be warmed and will have oil or gel put on it (will be lubricated). The speculum will then be inserted into your vagina. This will be used to hold apart the walls of your vagina so your  health care provider can see your cervix and the inside of your vagina.  A cotton swab will be used to place a small amount of a liquid (solution) on the areas to be examined. This solution makes it easier to see abnormal cells. You may feel a slight burning during this part.  The colposcope will be used to scan the cervix with a bright white light. The colposcope will be held near your vulva and will make your vulva, vagina, and cervix look bigger so they can be seen better.  If a biopsy is needed: ? You may be given a medicine to numb the area (local anesthetic). ? Surgical tools will be used to remove mucus and cells through your vagina. ? You may feel mild pain while the tissue sample is removed. ? Bleeding may occur. A solution may be used to stop the bleeding. ? If a biopsy is needed from the inside of the cervix, a different procedure called endocervical curettage (ECC) may be done. During this procedure, a curved tool called a curette will be used to scrape cells from your cervix or the top of your cervix (endocervix).  Any abnormalities that are found will be recorded. The procedure may vary among health care providers and hospitals. What happens after the procedure?  You will lie down and rest for a few minutes. You may be offered juice or cookies.  Your blood pressure, heart rate, breathing rate, and blood oxygen level will be monitored until you leave the hospital or clinic.  You may have some cramping in your abdomen. This should go away after a few minutes.  It is up to you to get the results of your procedure. Ask your health care provider, or the department that is doing the procedure, when your results will be ready. Summary  Colposcopy is a procedure to examine the lowest part of the uterus (cervix), the vagina, and the area around the vaginal opening (vulva)  for abnormalities or signs of disease.  A biopsy may be done as part of the procedure.  After the procedure,  you will remain lying down and will rest for a few minutes.  You may have some cramping in your abdomen. This should go away after a few minutes. This information is not intended to replace advice given to you by your health care provider. Make sure you discuss any questions you have with your health care provider. Document Revised: 11/25/2019 Document Reviewed: 11/25/2019 Elsevier Patient Education  2021 [employer].

## 2021-04-14 LAB — SURGICAL PATHOLOGY

## 2021-04-26 ENCOUNTER — Encounter: Payer: Self-pay | Admitting: Obstetrics

## 2021-04-26 ENCOUNTER — Telehealth (INDEPENDENT_AMBULATORY_CARE_PROVIDER_SITE_OTHER): Payer: Self-pay | Admitting: Obstetrics

## 2021-04-26 DIAGNOSIS — N870 Mild cervical dysplasia: Secondary | ICD-10-CM

## 2021-04-26 NOTE — Progress Notes (Signed)
GYNECOLOGY VIRTUAL VISIT ENCOUNTER NOTE  Provider location: [facility]   Patient location: Home  I connected with [name] on 04/26/21 at  1:30 PM EDT by MyChart Video Encounter and verified that I am speaking with the correct person using two identifiers.   I discussed the limitations, risks, security and privacy concerns of performing an evaluation and management service virtually and the availability of in person appointments. I also discussed with the patient that there may be a patient responsible charge related to this service. The patient expressed understanding and agreed to proceed.   History:  [name] is a 30 y.o. G106P1001 female being evaluated today for results of colposcopic biopsies and management recommendations. She denies any abnormal vaginal discharge, bleeding, pelvic pain or other concerns.       Past Medical History:  Diagnosis Date  . Herniated disc   . Medical history non-contributory   . UTI (lower urinary tract infection)    recurrent   Past Surgical History:  Procedure Laterality Date  . NO PAST SURGERIES     The following portions of the patient's history were reviewed and updated as appropriate: allergies, current medications, past family history, past medical history, past social history, past surgical history and problem list.    Review of Systems:  Pertinent items noted in HPI and remainder of comprehensive ROS otherwise negative.  Physical Exam:   General:  Alert, oriented and cooperative. Patient appears to be in no acute distress.  Mental Status: Normal mood and affect. Normal behavior. Normal judgment and thought content.   Respiratory: Normal respiratory effort, no problems with respiration noted  Rest of physical exam deferred due to type of encounter  Labs and Imaging Results for orders placed or performed in visit on 04/12/21 (from the past 336 hour(s))  Surgical pathology( [facility])   Collection Time: 04/12/21  3:03 PM  Result Value Ref Range   SURGICAL PATHOLOGY      SURGICAL PATHOLOGY CASE: [id number] PATIENT: [name] Surgical Pathology Report     Clinical History: LGSIL (cm)     FINAL MICROSCOPIC DIAGNOSIS:  A. ENDOCERVIX, CURETTAGE: - Benign endocervical mucosa.  B. CERVIX, 6 AND 12 O'CLOCK, BIOPSY: - Low-grade squamous intraepithelial lesion, CIN-1.    GROSS DESCRIPTION:  Specimen A: Received in formalin is blood tinged mucus that is entirely submitted in one block.  Volume: 1 x 1 x 0.2 cm  Specimen B: Received in formalin are pink-white soft tissue fragments that are submitted in toto. Number: 3.  Size: 0.2 to 0.5 cm blocks: 1  SW 04/13/2021   Final Diagnosis performed by Jimmy Picket, MD.   Electronically signed 04/14/2021 Technical component performed at [facility]. [facility], [address] [place], [place] [address].  Professional component performed at [facility], [address], [place], [place] [address].  Immunohistochemistry Technical component (if applicabl e) was performed at [facility]. [address], [place], [place] [address].   IMMUNOHISTOCHEMISTRY DISCLAIMER (if applicable): Some of these immunohistochemical stains may have been developed and the performance characteristics determine by [facility]. Some may not have been cleared or approved by the U.S. Food and Drug Administration. The FDA has determined that such clearance or approval is not necessary. This test is used for clinical purposes. It should not be regarded as investigational or for research. This laboratory is certified under the Clinical Laboratory Improvement Amendments of 1988 (CLIA-88) as qualified  to perform high complexity clinical laboratory testing.  The controls stained appropriately.   POCT urine pregnancy   Collection Time: 04/12/21  4:15 PM  Result  Value Ref Range   Preg Test, Ur Negative Negative   No results found.     Assessment and Plan:     1. Dysplasia of cervix, low grade (CIN 1) - repeat pap in 1 year      I discussed the assessment and treatment plan with the patient. The patient was provided an opportunity to ask questions and all were answered. The patient agreed with the plan and demonstrated an understanding of the instructions.   The patient was advised to call back or seek an in-person evaluation/go to the ED if the symptoms worsen or if the condition fails to improve as anticipated.  I have spent a total of 10 minutes of non-face-to-face time, excluding clinical staff time, reviewing notes and preparing to see patient, ordering tests and/or medications, and counseling the patient.   Coral Ceo, MD [facility], [facility] 04/26/21

## 2021-04-26 NOTE — Progress Notes (Signed)
RGYN virtual visit to F/U on colpo results.  Pt has no new concerns today.

## 2021-11-11 ENCOUNTER — Emergency Department (HOSPITAL_BASED_OUTPATIENT_CLINIC_OR_DEPARTMENT_OTHER)
Admission: EM | Admit: 2021-11-11 | Discharge: 2021-11-11 | Disposition: A | Discharge status: Home / Self Care | Payer: BC Managed Care – PPO | Attending: Emergency Medicine | Admitting: Emergency Medicine

## 2021-11-11 ENCOUNTER — Other Ambulatory Visit: Payer: Self-pay

## 2021-11-11 ENCOUNTER — Encounter (HOSPITAL_BASED_OUTPATIENT_CLINIC_OR_DEPARTMENT_OTHER): Payer: Self-pay | Admitting: *Deleted

## 2021-11-11 DIAGNOSIS — J111 Influenza due to unidentified influenza virus with other respiratory manifestations: Secondary | ICD-10-CM

## 2021-11-11 DIAGNOSIS — R059 Cough, unspecified: Secondary | ICD-10-CM | POA: Diagnosis present

## 2021-11-11 DIAGNOSIS — Z87891 Personal history of nicotine dependence: Secondary | ICD-10-CM | POA: Diagnosis not present

## 2021-11-11 DIAGNOSIS — H6122 Impacted cerumen, left ear: Secondary | ICD-10-CM | POA: Diagnosis not present

## 2021-11-11 DIAGNOSIS — J101 Influenza due to other identified influenza virus with other respiratory manifestations: Secondary | ICD-10-CM | POA: Diagnosis not present

## 2021-11-11 DIAGNOSIS — Z20822 Contact with and (suspected) exposure to covid-19: Secondary | ICD-10-CM | POA: Diagnosis not present

## 2021-11-11 LAB — RESP PANEL BY RT-PCR (FLU A&B, COVID) ARPGX2
Influenza A by PCR: POSITIVE — AB
Influenza B by PCR: NEGATIVE
SARS Coronavirus 2 by RT PCR: NEGATIVE

## 2021-11-11 MED ORDER — BENZONATATE 100 MG PO CAPS: 100.0000 mg | ORAL_CAPSULE | Freq: Three times a day (TID) | ORAL | 0 refills | Status: DC

## 2021-11-11 MED ORDER — ONDANSETRON HCL 4 MG PO TABS: 4.0000 mg | ORAL_TABLET | Freq: Three times a day (TID) | ORAL | 0 refills | Status: DC | PRN

## 2021-11-11 NOTE — ED Provider Notes (Signed)
[place] EMERGENCY DEPARTMENT Provider Note   CSN: [id number] Arrival date & time: 11/11/21  1558     History Chief Complaint  Patient presents with   Cough    [name] is a 30 y.o. female presents to the emergency department with complaint of flulike symptoms.  Patient reports that her symptoms started on Thursday and have been present since then.  Patient endorses sore throat, minimally productive cough, rhinorrhea, chills, nausea, vomiting, and subjective fevers.  Patient reports that cough is producing minimal amounts of yellow to clear mucus.  Patient states that sore throat is worse in the morning after waking.  Patient states that she has vomited once in the last 24 hours.  Describes emesis as clear.  Denies any associated abdominal pain.  Patient states that she has had improvement in her symptoms with over-the-counter cough medication and NyQuil.  Patient reports that family ember has been sick with RSV recently.  Denies any drooling, high potato voice, trismus, abdominal pain, pelvic pain, dysuria, hematuria, urinary frequency, vaginal pain, vaginal discharge, dysuria.   Cough Associated symptoms: fever (subjecive), rhinorrhea and sore throat   Associated symptoms: no chest pain, no chills, no headaches, no rash and no shortness of breath       Past Medical History:  Diagnosis Date   Herniated disc    Medical history non-contributory    UTI (lower urinary tract infection)    recurrent    Patient Active Problem List   Diagnosis Date Noted   LGSIL on Pap smear of cervix 02/13/2021   IUD (intrauterine device) in place 02/13/2021   Trichomonas infection 01/11/2021   Encounter for IUD insertion 01/10/2021   Cystitis, recurrent 03/27/2012   LUMBAGO 03/30/2010   KNEE PAIN, BILATERAL 03/04/2009   PES PLANUS 03/04/2009    Past Surgical History:  Procedure Laterality Date   NO PAST SURGERIES       OB History     Gravida  2   Para  1   Term   1   Preterm      AB      Living  1      SAB      IAB      Ectopic      Multiple  0   Live Births  1           Family History  Problem Relation Age of Onset   Hypertension Mother    Hypertension Maternal Grandmother    Hypertension Maternal Grandfather    Breast cancer Paternal Grandmother     Social History   Tobacco Use   Smoking status: Former    Types: Cigarettes    Quit date: 11/09/2014    Years since quitting: 7.0   Smokeless tobacco: Never  Vaping Use   Vaping Use: Some days  Substance Use Topics   Alcohol use: Yes    Comment: occ   Drug use: No    Home Medications Prior to Admission medications   Medication Sig Start Date End Date Taking? Authorizing Provider  etonogestrel-ethinyl estradiol (NUVARING) 0.12-0.015 MG/24HR vaginal ring Insert vaginally and leave in place for 3 consecutive weeks, then remove for 1 week. Patient not taking: No sig reported 01/23/18   Woodroe Mode, MD  metroNIDAZOLE (FLAGYL) 500 MG tablet Take two tablets by mouth twice a day, for one day.  Or you can take all four tablets at once if you can tolerate it. Patient not taking: No sig reported 01/12/21  Weinhold, Samantha C, CNM  naproxen (NAPROSYN) 500 MG tablet Take 1 tablet (500 mg total) by mouth 2 (two) times daily. Patient not taking: No sig reported 01/14/20   Suzy Bouchard, PA-C  Norgestimate-Ethinyl Estradiol Triphasic (TRI-SPRINTEC) 0.18/0.215/0.25 MG-35 MCG tablet Take 1 tablet by mouth daily. Patient not taking: No sig reported 11/21/20   Woodroe Mode, MD  ondansetron (ZOFRAN) 4 MG tablet Take 1 tablet (4 mg total) by mouth every 6 (six) hours as needed for nausea or vomiting. Patient not taking: No sig reported 10/25/19   Orpah Greek, MD  pantoprazole (PROTONIX) 40 MG tablet Take 1 tablet (40 mg total) by mouth daily. Patient not taking: No sig reported 10/25/19   Orpah Greek, MD  sucralfate (CARAFATE) 1 g tablet Take 1 tablet (1  g total) by mouth 4 (four) times daily -  with meals and at bedtime. Patient not taking: No sig reported 10/25/19   Orpah Greek, MD  norethindrone-ethinyl estradiol (JUNEL FE,GILDESS FE,LOESTRIN FE) 1-20 MG-MCG tablet Take 1 tablet by mouth daily.  10/25/19  [provider]    Allergies    Patient has no known allergies.  Review of Systems   Review of Systems  Constitutional:  Positive for fever (subjecive). Negative for chills.  HENT:  Positive for rhinorrhea and sore throat. Negative for congestion, drooling, facial swelling, trouble swallowing and voice change.   Eyes:  Negative for visual disturbance.  Respiratory:  Positive for cough. Negative for shortness of breath.   Cardiovascular:  Negative for chest pain.  Gastrointestinal:  Positive for nausea and vomiting. Negative for abdominal distention, abdominal pain, anal bleeding, blood in stool, constipation, diarrhea and rectal pain.  Genitourinary:  Negative for decreased urine volume, difficulty urinating, dysuria, flank pain, frequency, genital sores, hematuria, pelvic pain, urgency, vaginal bleeding, vaginal discharge and vaginal pain.  Musculoskeletal:  Negative for back pain, neck pain and neck stiffness.  Skin:  Negative for color change and rash.  Neurological:  Negative for dizziness, syncope, light-headedness and headaches.  Psychiatric/Behavioral:  Negative for confusion.    Physical Exam Updated Vital Signs BP 124/89 (BP Location: Left Arm)   Pulse 80   Temp 99.2 F (37.3 C) (Oral)   Resp 18   Ht 5\' 9"  (1.753 m)   Wt 93 kg   LMP 10/24/2021   SpO2 99%   BMI 30.27 kg/m   Physical Exam Vitals and nursing note reviewed.  Constitutional:      General: She is not in acute distress.    Appearance: She is not ill-appearing, toxic-appearing or diaphoretic.  HENT:     Head: Normocephalic.     Jaw: No trismus, swelling or malocclusion.     Right Ear: Tympanic membrane, ear canal and external ear  normal. No laceration, drainage, swelling or tenderness. There is no impacted cerumen. No mastoid tenderness.     Left Ear: External ear normal. No laceration, drainage, swelling or tenderness. There is impacted cerumen. No mastoid tenderness.     Ears:     Comments: No auricle proptosis bilaterally    Mouth/Throat:     Lips: Pink. No lesions.     Mouth: Mucous membranes are moist.     Tongue: No lesions. Tongue does not deviate from midline.     Palate: No mass and lesions.     Pharynx: Oropharynx is clear. Uvula midline. No pharyngeal swelling, oropharyngeal exudate, posterior oropharyngeal erythema or uvula swelling.     Tonsils: No tonsillar exudate or  tonsillar abscesses. 1+ on the right. 1+ on the left.     Comments: Handles oral secretions without difficulty Eyes:     General: No scleral icterus.       Right eye: No discharge.        Left eye: No discharge.  Cardiovascular:     Rate and Rhythm: Normal rate.  Pulmonary:     Effort: Pulmonary effort is normal. No tachypnea, bradypnea or respiratory distress.     Breath sounds: Normal breath sounds. No stridor. No wheezing, rhonchi or rales.     Comments: Speaks in full sentences without difficulty Abdominal:     General: Abdomen is flat. There is no distension. There are no signs of injury.     Palpations: Abdomen is soft. There is no mass or pulsatile mass.     Tenderness: There is no abdominal tenderness. There is no guarding or rebound.  Musculoskeletal:     Cervical back: Normal range of motion and neck supple. No edema, erythema, signs of trauma, rigidity, torticollis or crepitus. No pain with movement, spinous process tenderness or muscular tenderness. Normal range of motion.  Lymphadenopathy:     Cervical: No cervical adenopathy.  Skin:    General: Skin is warm and dry.  Neurological:     General: No focal deficit present.     Mental Status: She is alert.     GCS: GCS eye subscore is 4. GCS verbal subscore is 5. GCS  motor subscore is 6.  Psychiatric:        Behavior: Behavior is cooperative.    ED Results / Procedures / Treatments   Labs (all labs ordered are listed, but only abnormal results are displayed) Labs Reviewed  RESP PANEL BY RT-PCR (FLU A&B, COVID) [id number]    EKG None  Radiology No results found.  Procedures Procedures   Medications Ordered in ED Medications - No data to display  ED Course  I have reviewed the triage vital signs and the nursing notes.  Pertinent labs & imaging results that were available during my care of the patient were reviewed by me and considered in my medical decision making (see chart for details).    MDM Rules/Calculators/A&P                           Alert 30 year old female no acute distress, nontoxic-appearing.  Presents emergency department with flulike symptoms.  Patient is afebrile and without tachycardia.    Lungs clear to auscultation bilaterally.  Patient able speak in full sentences without difficulty.  Suspicion for pneumonia at this time.  Patient has no trismus.  Able to handle oral secretions without difficulty.  Neck is supple and has full range of motion.  No swelling or exudate to tonsils bilaterally or oropharynx.  No peritonsillar abscess.  No swelling to submandibular space.  Low suspicion for strep pharyngitis, pharyngitis, Ludewig's angina, or deep space neck infection.  Suspect that patient symptoms are due to viral illness.  Patient was swabbed for COVID-19 and influenza.  Testing pending at this time.  Patient given information to follow-up with test results on her [facility] MyChart.  Discussed symptomatic treatment with patient.  Discussed self-isolation with patient.  Will prescribe patient with short course of Tessalon.  Patient to follow-up with PCP if symptoms do not improve.  Discussed results, findings, treatment and follow up. Patient advised of return precautions. Patient verbalized understanding and agreed with  plan.   Final Clinical  Impression(s) / ED Diagnoses Final diagnoses:  Influenza-like illness    Rx / DC Orders ED Discharge Orders          Ordered    benzonatate (TESSALON) 100 MG capsule  Every 8 hours        11/11/21 1700    ondansetron (ZOFRAN) 4 MG tablet  Every 8 hours PRN        11/11/21 1700             Berneice Heinrich 11/12/21 2595    Alvira Monday, MD 11/12/21 1145

## 2021-11-11 NOTE — Discharge Instructions (Addendum)
[facility] to the [facility] today to be evaluated for your flulike symptoms.  You were swabbed for COVID-19 and influenza.  This test is pending.  You can find results on your [facility] MyChart.  If positive for either of these viruses he will need to self isolate at home for 5 days from your symptom onset.  Additionally please remain at home until you are fever free for 24 hours without the use of any ibuprofen or Tylenol.  You can alternate Tylenol/acetaminophen and Advil/ibuprofen/Motrin every 4 hours for sore throat, body aches, headache or fever.  Do not take more than 3,000 mg tylenol in a 24 hour period.  Please check all medication labels as many medications such as pain and cold medications may contain tylenol.  Do not drink alcohol while taking these medications.  Do not take other NSAID'S while taking ibuprofen (such as aleve or naproxen).  Please take ibuprofen with food to decrease stomach upset.  Please make sure to stay well hydrated.  Drink plenty of water or watered down sports drinks.  If drinking sports drinks please stay away from red colored drinks; as they can cause confusion for bleeding if you vomit.   Please make sure to practice good hand hygiene to help prevent the spread of flu.  You may use saline nasal spray for congestion.  Can use over-the-counter cough medication to help with your cough.  Follow up with your primary care provider if symptoms persist.  Return to the ER for inability to swallow liquids, difficulty breathing, or new or worsening symptoms.

## 2021-11-11 NOTE — ED Notes (Signed)
RT assessed in triage. No distress noted. BBS clear. SAT 100%

## 2021-11-11 NOTE — ED Notes (Signed)
Cleared for discharge after ear exam per PA.

## 2021-11-11 NOTE — ED Notes (Signed)
Pt requesting provider to examine ears before discharge, forgot to mention ear pressure with initial exam.

## 2021-11-11 NOTE — ED Triage Notes (Signed)
Pt reports cough and sore throat since Wednesday. States she has been 'more winded" than usual. Used her inhaler without relief. She had negative home covid test this morning

## 2022-05-01 ENCOUNTER — Ambulatory Visit: Payer: BC Managed Care – PPO

## 2022-08-12 ENCOUNTER — Emergency Department (HOSPITAL_BASED_OUTPATIENT_CLINIC_OR_DEPARTMENT_OTHER)
Admission: EM | Admit: 2022-08-12 | Discharge: 2022-08-12 | Disposition: A | Discharge status: Home / Self Care | Payer: BLUE CROSS/BLUE SHIELD | Attending: Emergency Medicine | Admitting: Emergency Medicine

## 2022-08-12 ENCOUNTER — Encounter (HOSPITAL_BASED_OUTPATIENT_CLINIC_OR_DEPARTMENT_OTHER): Payer: Self-pay | Admitting: Emergency Medicine

## 2022-08-12 ENCOUNTER — Other Ambulatory Visit: Payer: Self-pay

## 2022-08-12 DIAGNOSIS — R197 Diarrhea, unspecified: Secondary | ICD-10-CM

## 2022-08-12 DIAGNOSIS — J069 Acute upper respiratory infection, unspecified: Secondary | ICD-10-CM | POA: Diagnosis not present

## 2022-08-12 DIAGNOSIS — R0981 Nasal congestion: Secondary | ICD-10-CM | POA: Diagnosis present

## 2022-08-12 DIAGNOSIS — Z20822 Contact with and (suspected) exposure to covid-19: Secondary | ICD-10-CM | POA: Diagnosis not present

## 2022-08-12 LAB — SARS CORONAVIRUS 2 BY RT PCR: SARS Coronavirus 2 by RT PCR: NEGATIVE

## 2022-08-12 MED ORDER — CETIRIZINE HCL 10 MG PO TABS: 10.0000 mg | ORAL_TABLET | Freq: Every day | ORAL | 0 refills | Status: DC

## 2022-08-12 MED ORDER — GUAIFENESIN ER 600 MG PO TB12: 600.0000 mg | ORAL_TABLET | Freq: Two times a day (BID) | ORAL | 0 refills | Status: AC

## 2022-08-12 NOTE — ED Provider Notes (Signed)
[facility] EMERGENCY DEPARTMENT Provider Note   CSN: [id number] Arrival date & time: 08/12/22  1620     History  Chief Complaint  Patient presents with   Nasal Congestion   Diarrhea    [name] is a 31 y.o. female presenting today with 6 days of congestion, runny nose, and overall feeling unwell.  Also reports 2 days of preceding diarrhea before upper respiratory symptom onset, which has now almost fully resolved.  Denies fever, chills, neck stiffness, nausea, vomiting, or abdominal pain.  Feels mildly dehydrated.  Has tried Sudafed and Tylenol Cold and flu without much success.  Also using humidifier nightly due which provides some relief.  No known recent sick contacts.  No Hx of asthma or significant seasonal allergies.  The history is provided by the patient and medical records.  Diarrhea    Home Medications Prior to Admission medications   Medication Sig Start Date End Date Taking? Authorizing Provider  cetirizine (ZYRTEC ALLERGY) 10 MG tablet Take 1 tablet (10 mg total) by mouth daily for 14 days. 08/12/22 08/26/22 Yes Cecil Cobbs, PA-C  guaiFENesin (MUCINEX) 600 MG 12 hr tablet Take 1 tablet (600 mg total) by mouth 2 (two) times daily for 7 days. 08/12/22 08/19/22 Yes Cecil Cobbs, PA-C  benzonatate (TESSALON) 100 MG capsule Take 1 capsule (100 mg total) by mouth every 8 (eight) hours. 11/11/21   Haskel Schroeder, PA-C  etonogestrel-ethinyl estradiol (NUVARING) 0.12-0.015 MG/24HR vaginal ring Insert vaginally and leave in place for 3 consecutive weeks, then remove for 1 week. Patient not taking: No sig reported 01/23/18   Adam Phenix, MD  metroNIDAZOLE (FLAGYL) 500 MG tablet Take two tablets by mouth twice a day, for one day.  Or you can take all four tablets at once if you can tolerate it. Patient not taking: No sig reported 01/12/21   Clayton Bibles C, CNM  naproxen (NAPROSYN) 500 MG tablet Take 1 tablet (500 mg total) by mouth 2 (two) times  daily. Patient not taking: No sig reported 01/14/20   Mannie Stabile, PA-C  Norgestimate-Ethinyl Estradiol Triphasic (TRI-SPRINTEC) 0.18/0.215/0.25 MG-35 MCG tablet Take 1 tablet by mouth daily. Patient not taking: No sig reported 11/21/20   Adam Phenix, MD  ondansetron (ZOFRAN) 4 MG tablet Take 1 tablet (4 mg total) by mouth every 8 (eight) hours as needed for nausea or vomiting. 11/11/21   Haskel Schroeder, PA-C  pantoprazole (PROTONIX) 40 MG tablet Take 1 tablet (40 mg total) by mouth daily. Patient not taking: No sig reported 10/25/19   Gilda Crease, MD  sucralfate (CARAFATE) 1 g tablet Take 1 tablet (1 g total) by mouth 4 (four) times daily -  with meals and at bedtime. Patient not taking: No sig reported 10/25/19   Gilda Crease, MD  norethindrone-ethinyl estradiol (JUNEL FE,GILDESS FE,LOESTRIN FE) 1-20 MG-MCG tablet Take 1 tablet by mouth daily.  10/25/19  [provider]      Allergies    Patient has no known allergies.    Review of Systems   Review of Systems  HENT:  Positive for congestion and rhinorrhea.   Gastrointestinal:  Positive for diarrhea.    Physical Exam Updated Vital Signs BP (!) 128/92   Pulse 64   Temp 99.2 F (37.3 C) (Oral)   Resp 18   Ht 5\' 9"  (1.753 m)   Wt 93 kg   LMP 07/24/2022   SpO2 99%   BMI 30.27 kg/m  Physical Exam  Vitals and nursing note reviewed.  Constitutional:      General: She is not in acute distress.    Appearance: Normal appearance. She is well-developed. She is not ill-appearing or toxic-appearing.  HENT:     Head: Normocephalic and atraumatic.     Right Ear: Ear canal and external ear normal. A middle ear effusion is present.     Left Ear: Ear canal and external ear normal. A middle ear effusion is present.     Ears:     Comments: Bilateral middle ear effusions with clear fluid, able to still depict bony landmarks.  Without TM injection, erythema, or significant bulge.  Internal and  external ears appear nontender on exam.    Nose: Congestion and rhinorrhea (Clear) present.     Right Turbinates: Swollen.     Left Turbinates: Swollen.     Mouth/Throat:     Lips: Pink.     Tongue: Tongue does not deviate from midline.     Palate: No mass and lesions.     Pharynx: Oropharynx is clear. Uvula midline. Posterior oropharyngeal erythema (Mild) present. No pharyngeal swelling, oropharyngeal exudate or uvula swelling.     Tonsils: No tonsillar exudate or tonsillar abscesses.  Eyes:     General:        Right eye: No discharge.        Left eye: No discharge.     Conjunctiva/sclera: Conjunctivae normal.  Neck:     Comments: No meningismus or torticollis Cardiovascular:     Rate and Rhythm: Normal rate and regular rhythm.     Pulses: Normal pulses.     Heart sounds: No murmur heard. Pulmonary:     Effort: Pulmonary effort is normal. No respiratory distress.     Breath sounds: Normal breath sounds. No wheezing or rales.     Comments: CTAB, able to communicate without difficulty, without increased respiratory effort Chest:     Chest wall: No tenderness.  Abdominal:     General: There is no distension.     Palpations: Abdomen is soft.     Tenderness: There is no abdominal tenderness. There is no right CVA tenderness, left CVA tenderness or guarding.  Musculoskeletal:        General: No swelling.     Cervical back: Neck supple. No rigidity.     Right lower leg: No edema.     Left lower leg: No edema.  Lymphadenopathy:     Cervical: Cervical adenopathy present.  Skin:    General: Skin is warm and dry.     Capillary Refill: Capillary refill takes less than 2 seconds.     Coloration: Skin is not jaundiced or pale.  Neurological:     Mental Status: She is alert and oriented to person, place, and time.  Psychiatric:        Mood and Affect: Mood normal.     ED Results / Procedures / Treatments   Labs (all labs ordered are listed, but only abnormal results are  displayed) Labs Reviewed  SARS CORONAVIRUS 2 BY RT PCR    EKG None  Radiology No results found.  Procedures Procedures    Medications Ordered in ED Medications - No data to display  ED Course/ Medical Decision Making/ A&P                           Medical Decision Making  31 y.o. female presents to the ED for concern of Nasal  Congestion and Diarrhea   This involves an extensive number of treatment options, and is a complaint that carries with it a high risk of complications and morbidity.  The emergent differential diagnosis prior to evaluation includes, but is not limited to: Viral upper respiratory infection, strep pharyngitis, bronchitis  This is not an exhaustive differential.   Past Medical History / Co-morbidities / Social History: No relevant PMHx Social Determinants of Health include: No PCP, resources provided  Additional History:  None  Lab Tests: I ordered, and personally interpreted labs.  The pertinent results include:   COVID-negative  Imaging Studies: None  ED Course: Pt well-appearing on exam.  Nontoxic, nonseptic appearing in NAD.  Sitting comfortably, afebrile.  Hemodynamically stable.  Presenting with about 1 week of upper respiratory symptoms.  Has tried Sudafed and Tylenol Cold and flu without much relief.  Also utilizing humidifier.   No swollen, erythematous, or exudative tonsils appreciated.  Very mild posterior oropharynx erythema.  Mild cervical lymphadenopathy without dysphagia.  Airway patent.  Able to swallow without difficulty.  Presentation nonconcerning for PTA or RPA.  Your exam without clinical evidence of otitis media or otitis externa, however suggestive mild middle ear effusion.  Likely due to congestion.  Negative COVID.  Considered imaging, however without shortness of breath, wheezing, cough, or chest tightness on exam.  Lungs CTAB, moving air and communicating well, and does not appear to be in respiratory distress.  Low suspicion  of pneumonia or severe bronchitis. Patients symptoms are consistent with URI, likely viral etiology. Discussed that antibiotics are not indicated for viral infections. Pt will be discharged with symptomatic treatment.  Recommend close follow-up with PCP within the next few days for reevaluation as needed.  Patient satisfied with today's encounter.  Patient in NAD and in good condition at time of discharge.  Disposition: After consideration the patient's encounter today, I do not feel today's workup suggests an emergent condition requiring admission or immediate intervention beyond what has been performed at this time.  Safe for discharge; instructed to return immediately for worsening symptoms, change in symptoms or any other concerns.  I have reviewed the patients home medicines and have made adjustments as needed.  Discussed course of treatment with the patient, whom demonstrated understanding.  Patient in agreement and has no further questions.     This chart was dictated using voice recognition software.  Despite best efforts to proofread, errors can occur which can change the documentation meaning.         Final Clinical Impression(s) / ED Diagnoses Final diagnoses:  Viral URI with cough  Diarrhea, unspecified type    Rx / DC Orders ED Discharge Orders          Ordered    guaiFENesin (MUCINEX) 600 MG 12 hr tablet  2 times daily        08/12/22 1849    cetirizine (ZYRTEC ALLERGY) 10 MG tablet  Daily        08/12/22 1849              Sandrea Hammond 08/12/22 [place] Charon, MD 08/12/22 2153

## 2022-08-12 NOTE — ED Triage Notes (Signed)
Pt c/o congestion and "sinus issues" since Mon; reports diarrhea since Sat

## 2022-08-12 NOTE — Discharge Instructions (Addendum)
Your COVID test today was negative.  Based on your exam today, you are likely on the tail end of a viral upper respiratory infection.  Further information regarding upper respiratory infections has been provided for you to review at your leisure.  2 prescriptions have been sent to your pharmacy, which include: Mucinex-1 capsule every 12 hours for 1 week to help break up mucus Zyrtec-take 1 tablet daily for 2 week for congestion relief  Try to utilize drinking hot tea with honey, as this is one of the best cough suppressants as discussed.  You may also utilize over-the-counter's's Cepacol lozenges for additional relief.  Continue to focus on remaining hydrated, using your humidifier, and maintaining adequate nutritional intake.    You may also take extra strength (650 mg) Tylenol or 1000 mg of Tylenol every 6 hours, for a maximum dosage of 4000 mg/day.  Keep in mind over the counter medications sometimes have Tylenol in the ingredients, so please keep watch out to not exceed the maximum daily limit.  Follow-up with your primary care within the next 3 to 5 days for reevaluation and continued medical management  Return to the ED for any new or worsening symptoms as discussed.   If you do not have a doctor see the list below.  RESOURCE GUIDE  Chronic Pain Problems: Contact [facility]  [phone] Patients need to be referred by their primary care doctor.  Insufficient Money for Medicine: Contact United Way:  call "211" or [facility] [phone].  No Primary Care Doctor: Call [facility] Connect  [phone] - can help you locate a primary care doctor that  accepts your insurance, provides certain services, etc. Physician Referral Service- [phone] Agencies that provide inexpensive medical care: [facility]  [phone] [facility]  [phone] [facility]  [phone] [facility]  [phone] Planned Parenthood   [phone] [facility]  [phone]  Medicaid-accepting [facility] Providers: [facility]- [address]  [phone], Mon-Fri 9am-7pm, Sat 9am-1pm [facility]- [address] [place], [address] [place]  [phone] [facility]- [address] [place]  [phone] [facility]- [address]  [phone] Renaye Rakers- [address] [place], [address], [phone]  Only accepts [place] patients after they have their name  applied to their card  Self Pay (no insurance) in [facility]: Sickle Cell Patients: Dr Willey Blade, [facility] Internal Medicine  [address] [place], [phone] [facility]- [address] [place]  [phone]       [facility] [place]- [address]       -     [facility]- see information above (Speak to [employer] if you do not have insurance)       -  [facility]- [address] [place], [phone]       -  [facility]- [address],  [phone]       -  [facility]- [address], [phone]       -  Dr Julio Sicks-  [address], [place], [phone]       -  [facility]- [address], [phone]       -  [facility]- [address] [phone], also [address]  [address], [phone]       -    [facility]- [address] [place], [phone], 1st & 3rd Saturday   every month, 10am-1pm  1) Find a Doctor and Pay Out of Pocket Although you won't have to find out who is covered by your insurance plan, it is a good idea to ask around and get recommendations. You will then need to call the office and see if the doctor you have chosen will accept you as a new patient and what types of options they offer for patients who are self-pay. Some doctors offer discounts or will set up payment plans for their patients who do not have insurance, but you will need to  ask so you aren't surprised when you get to your appointment.  2) Contact Your Local Health Department Not all health departments have doctors that can see patients for sick visits, but many do, so it is worth a call to see if yours does. If you don't know where your local health department is, you can check in your phone book. The CDC also has a tool to help you locate your state's health department, and many state websites also have listings of all of their local health departments.  3) Find a Walk-in Clinic If your illness is not likely to be very severe or complicated, you may want to try a walk in clinic. These are popping up all over the country in pharmacies, drugstores, and shopping centers. They're usually staffed by nurse practitioners or physician assistants that have been trained to treat common illnesses and complaints. They're usually fairly quick and inexpensive. However, if you have serious medical issues or chronic medical problems, these are probably not your best option  STD Testing [facility] Department of [facility], STD Clinic, [address] [place], phone [phone] or [phone].  Monday - Friday, call for an appointment. [facility] Department of [employer], STD Clinic, [address], [place], phone [phone] or [phone].  Monday - Friday, call for an appointment.  Abuse/Neglect: [facility] Child Abuse Hotline ([phone] 269-5483 [facility] Child Abuse Hotline 762 196 1419 (After Hours)  Emergency Shelter:  [facility] 272-234-0258  Maternity Homes: Room at the [place] [facility] (845)624-7658 [facility] (678)[phone]  MRSA Hotline #:   (782) 043-1716  [facility] Resources  [facility] [place]     United Way                          [facility] Dept. [address] [place]                       [address]      [address] [place] [address]   [place]                                                Cristobal Goldmann Phone:  [phone]  Phone:  [phone]                 Phone:  [phone]  [facility], [phone] [facility] [place]- [phone]       -     [facility] in [place], [address]                                  [phone], [facility] Child Abuse Hotline 714-095-6894 or 206 284 7309 (After Hours)  Behavioral Health Services  Substance Abuse Resources: Alcohol and Drug Services  219-604-8471 [facility] (727)823-3636 [facility] [place] [phone] Floydene Flock (231)[phone] Residential & Outpatient Substance Abuse Program  [phone]  Psychological Services: [facility]  [phone] [facility] Services  [phone] [facility], [phone] [place]. [address] [place], ACCESS LINE: (660)744-5510 or 520-041-8448, [employer]an.co.za  Dental Assistance  Patients with Medicaid: [facility] Dental ([phone] [address]                                           [phone] [address] [employer] Phone:  [phone]                                                  Phone:  [phone]  If unable to pay or uninsured, contact:  [facility] or [facility]. to become qualified for the adult dental clinic.  Patients with Medicaid: [facility] [phone] [address], [phone] [address], [phone]  If unable to pay, or uninsured, contact [facility] (407)432-1627) or [facility] Department [phone] in [place], [phone] in [facility]) to become qualified for the adult dental clinic  Other Low-Cost Community Dental Services: [facility]- [address] [place], [place], [place], [address], [phone], Ext. [phone], 2nd  and 4th Thursday of the month at 6:30am.  10 clients each day by appointment, can sometimes see walk-in patients if someone does not show for an appointment. [facility]- [address] [place], [place], [address], [phone] [facility] [address] [place], [place], [address], [phone] [facility] Health Department- [phone] [facility] Department- [phone] [facility] [phone]

## 2022-11-05 ENCOUNTER — Other Ambulatory Visit (HOSPITAL_COMMUNITY)
Admission: RE | Admit: 2022-11-05 | Discharge: 2022-11-05 | Disposition: A | Discharge status: Home / Self Care | Payer: BLUE CROSS/BLUE SHIELD | Source: Ambulatory Visit | Attending: Advanced Practice Midwife | Admitting: Advanced Practice Midwife

## 2022-11-05 ENCOUNTER — Encounter: Payer: Self-pay | Admitting: Advanced Practice Midwife

## 2022-11-05 ENCOUNTER — Ambulatory Visit (INDEPENDENT_AMBULATORY_CARE_PROVIDER_SITE_OTHER): Payer: BLUE CROSS/BLUE SHIELD | Admitting: Advanced Practice Midwife

## 2022-11-05 VITALS — BP 130/89 | HR 95 | Ht 69.0 in | Wt 212.8 lb

## 2022-11-05 DIAGNOSIS — T8384XA Pain from genitourinary prosthetic devices, implants and grafts, initial encounter: Secondary | ICD-10-CM | POA: Diagnosis not present

## 2022-11-05 DIAGNOSIS — Z113 Encounter for screening for infections with a predominantly sexual mode of transmission: Secondary | ICD-10-CM

## 2022-11-05 DIAGNOSIS — R87612 Low grade squamous intraepithelial lesion on cytologic smear of cervix (LGSIL): Secondary | ICD-10-CM

## 2022-11-05 DIAGNOSIS — Z01419 Encounter for gynecological examination (general) (routine) without abnormal findings: Secondary | ICD-10-CM | POA: Diagnosis not present

## 2022-11-05 DIAGNOSIS — N870 Mild cervical dysplasia: Secondary | ICD-10-CM

## 2022-11-05 DIAGNOSIS — Z01411 Encounter for gynecological examination (general) (routine) with abnormal findings: Secondary | ICD-10-CM

## 2022-11-05 NOTE — Progress Notes (Signed)
   Subjective:     [name] is a 31 y.o. female here at [facility] for a routine exam.  Current complaints: Recent cramping that resolved, pain with intercourse.  Personal health questionnaire reviewed: yes.  Do you have a primary care provider? yes Do you feel safe at home? yes  Flowsheet Row Office Visit [facility]/20[facility]  PHQ-2 Total Score 0       Health Maintenance Due  Topic Date Due   COVID-19 Vaccine (1) Never done   INFLUENZA VACCINE  07/10/2022     Risk factors for chronic health problems: Smoking: Alchohol/how much: Pt BMI: Body mass index is 31.43 kg/m.   Gynecologic History No LMP recorded. (Menstrual status: IUD). Contraception: IUD Last Pap: 2022. Results were: abnormal with LSIL/HPV, colpo with CIN 1 Last mammogram: n/a.   Obstetric History OB History  Gravida Para Term Preterm AB Living  2 1 1   1 1   SAB IAB Ectopic Multiple Live Births    1   0 1    # Outcome Date GA Lbr Len/2nd Weight Sex Delivery Anes PTL Lv  2 IAB 2019          1 Term 09/01/15 106w2d 12:30 / 00:37 7 lb 14.1 oz (3.575 kg) F Vag-Spont EPI  LIV     Birth Comments: WNL     The following portions of the patient's history were reviewed and updated as appropriate: allergies, current medications, past family history, past medical history, past social history, past surgical history, and problem list.  Review of Systems Pertinent items noted in HPI and remainder of comprehensive ROS otherwise negative.    Objective:   VS reviewed, nursing note reviewed,  Constitutional: well developed, well nourished, no distress HEENT: normocephalic, thyroid without enlargement or mass HEART: RRR, no murmurs rubs/gallops RESP: clear and equal to auscultation bilaterally in all lobes  Breast Exam:  Deferred with low risks and shared decision making, discussed recommendation to start mammogram between 40-50 yo/  Abdomen: soft Neuro: alert and oriented  x 3 Skin: warm, dry Psych: affect normal Pelvic exam: Performed: Cervix pink, visually closed, without lesion, scant white creamy discharge, vaginal walls and external genitalia normal Bimanual exam: Cervix 0/long/high, firm, anterior, neg CMT, uterus nontender, nonenlarged, adnexa without tenderness, enlargement, or mass        Assessment/Plan:   1. Dysplasia of cervix, low grade (CIN 1) --HPV based testing in 1 year recommended.  Pap today. - Cytology - PAP( [facility])  2. Pain due to intrauterine contraceptive device (IUD), initial encounter (HCC) --Pt reports cramping and painful intercourse 1-2 months ago. Has not had intercourse since.  Partner could feel IUD. --Exam today wnl, IUD string ~ 4 cm in length from cervical os  - [redacted]w[redacted]d PELVIC COMPLETE WITH TRANSVAGINAL; Future  3. Well woman exam with routine gynecological exam   4. LGSIL on Pap smear of cervix   5. Routine screening for STI (sexually transmitted infection)  - RPR - HIV antibody (with reflex) - Hepatitis B Surface AntiGEN - Hepatitis C Antibody - Cervicovaginal ancillary only( [place])    Return if symptoms worsen or fail to improve, for Follow up based on [place] results and pt symptoms.   [place], CNM 4:58 PM

## 2022-11-05 NOTE — Progress Notes (Signed)
Pt presents for AEX. Pt requesting STD testing. Pt c/o abnormal cramping that has resolved itself. Pt reports pain with intercourse 1 month ago and states partner can feel IUD sometimes. IUD placed 01/2021

## 2022-11-06 LAB — HIV ANTIBODY (ROUTINE TESTING W REFLEX): HIV Screen 4th Generation wRfx: NONREACTIVE

## 2022-11-06 LAB — HEPATITIS B SURFACE ANTIGEN: Hepatitis B Surface Ag: NEGATIVE

## 2022-11-06 LAB — RPR: RPR Ser Ql: NONREACTIVE

## 2022-11-06 LAB — HEPATITIS C ANTIBODY: Hep C Virus Ab: NONREACTIVE

## 2022-11-07 ENCOUNTER — Encounter: Payer: Self-pay | Admitting: Advanced Practice Midwife

## 2022-11-07 LAB — CERVICOVAGINAL ANCILLARY ONLY
Chlamydia: NEGATIVE
Comment: NEGATIVE
Comment: NEGATIVE
Comment: NORMAL
Neisseria Gonorrhea: NEGATIVE
Trichomonas: NEGATIVE

## 2022-11-12 ENCOUNTER — Ambulatory Visit (HOSPITAL_BASED_OUTPATIENT_CLINIC_OR_DEPARTMENT_OTHER): Payer: BLUE CROSS/BLUE SHIELD

## 2022-11-13 LAB — CYTOLOGY - PAP
Comment: NEGATIVE
Diagnosis: UNDETERMINED — AB
High risk HPV: NEGATIVE

## 2022-11-25 ENCOUNTER — Encounter (HOSPITAL_BASED_OUTPATIENT_CLINIC_OR_DEPARTMENT_OTHER): Payer: Self-pay | Admitting: Advanced Practice Midwife

## 2022-11-25 DIAGNOSIS — R87610 Atypical squamous cells of undetermined significance on cytologic smear of cervix (ASC-US): Secondary | ICD-10-CM | POA: Insufficient documentation

## 2024-09-09 ENCOUNTER — Encounter (HOSPITAL_BASED_OUTPATIENT_CLINIC_OR_DEPARTMENT_OTHER): Payer: Self-pay | Admitting: Urology

## 2024-09-09 ENCOUNTER — Other Ambulatory Visit: Payer: Self-pay

## 2024-09-09 ENCOUNTER — Emergency Department (HOSPITAL_BASED_OUTPATIENT_CLINIC_OR_DEPARTMENT_OTHER)
Admission: EM | Admit: 2024-09-09 | Discharge: 2024-09-09 | Disposition: A | Discharge status: Home / Self Care | Attending: Emergency Medicine | Admitting: Emergency Medicine

## 2024-09-09 ENCOUNTER — Emergency Department (HOSPITAL_BASED_OUTPATIENT_CLINIC_OR_DEPARTMENT_OTHER)

## 2024-09-09 DIAGNOSIS — R051 Acute cough: Secondary | ICD-10-CM | POA: Diagnosis not present

## 2024-09-09 DIAGNOSIS — R0981 Nasal congestion: Secondary | ICD-10-CM | POA: Diagnosis not present

## 2024-09-09 DIAGNOSIS — R059 Cough, unspecified: Secondary | ICD-10-CM | POA: Diagnosis present

## 2024-09-09 DIAGNOSIS — J029 Acute pharyngitis, unspecified: Secondary | ICD-10-CM | POA: Diagnosis not present

## 2024-09-09 DIAGNOSIS — R5381 Other malaise: Secondary | ICD-10-CM | POA: Insufficient documentation

## 2024-09-09 LAB — RESP PANEL BY RT-PCR (RSV, FLU A&B, COVID)  RVPGX2
Influenza A by PCR: NEGATIVE
Influenza B by PCR: NEGATIVE
Resp Syncytial Virus by PCR: NEGATIVE
SARS Coronavirus 2 by RT PCR: NEGATIVE

## 2024-09-09 MED ORDER — OXYMETAZOLINE HCL 0.05 % NA SOLN
1.0000 | Freq: Once | NASAL | Status: AC
Start: 2024-09-09 — End: 2024-09-09
  Administered 2024-09-09: 1 via NASAL
  Filled 2024-09-09: qty 30

## 2024-09-09 MED ORDER — BENZONATATE 100 MG PO CAPS: 100.0000 mg | ORAL_CAPSULE | Freq: Three times a day (TID) | ORAL | 0 refills | Status: AC

## 2024-09-09 NOTE — ED Triage Notes (Signed)
 Pt states has had cold and flu symptoms since Sunday  States has been having cough and chest congestion  Friend at work called and states had whooping cough

## 2024-09-09 NOTE — Discharge Instructions (Signed)
 As we discussed, you can use the Afrin for the next 3 days.  Then you need to stop as it can cause rebound congestion.  I have prescribed you Tessalon  Perles which can help with the cough.  Please keep this out of the reach of children as it is toxic for young kids.  I would like for you to follow-up with your primary care doctor as needed.  You can return to the emergency department for any worsening symptoms.  Continue taking Mucinex , tea, honey, ibuprofen  as needed for other symptoms.

## 2024-09-09 NOTE — ED Provider Notes (Signed)
 [facility] Provider Note   CSN: [id number] Arrival date & time: 09/09/24  1701     Patient presents with: Cough   [name] is a 33 y.o. female patient who presents to the emergency department today for further evaluation of upper respiratory infectious type symptoms that started on Sunday.  She states that her symptoms originally started with just a tickle in her throat.  She felt a little worse on Monday and then Tuesday she woke up feeling like she got hit by a truck.  She is complaining of nonproductive deep cough with a burning sensation in her chest after she coughs, mild sore throat, nasal congestion, and general malaise.  She denies any shortness of breath or chest pain.  She denies any nausea, vomiting, diarrhea.  She states coworkers have something similar.  No fever.    Cough      Prior to Admission medications   Medication Sig Start Date End Date Taking? Authorizing Provider  benzonatate  (TESSALON ) 100 MG capsule Take 1 capsule (100 mg total) by mouth every 8 (eight) hours. 09/09/24  Yes Akesha Uresti M, PA-C  norethindrone-ethinyl estradiol  (JUNEL FE,GILDESS FE,LOESTRIN FE) 1-20 MG-MCG tablet Take 1 tablet by mouth daily.  10/25/19  [provider]    Allergies: Patient has no known allergies.    Review of Systems  Respiratory:  Positive for cough.   All other systems reviewed and are negative.   Updated Vital Signs BP 130/85 (BP Location: Left Arm)   Pulse 94   Temp 97.6 F (36.4 C) (Oral)   Resp 20   Ht 5' 9 (1.753 m)   Wt 96.5 kg   SpO2 100%   BMI 31.42 kg/m   Physical Exam Vitals and nursing note reviewed.  Constitutional:      General: She is not in acute distress.    Appearance: Normal appearance.  HENT:     Head: Normocephalic and atraumatic.  Eyes:     General:        Right eye: No discharge.        Left eye: No discharge.  Cardiovascular:     Comments: Regular rate and  rhythm.  S1/S2 are distinct without any evidence of murmur, rubs, or gallops.  Radial pulses are 2+ bilaterally.  Dorsalis pedis pulses are 2+ bilaterally.  No evidence of pedal edema. Pulmonary:     Comments: Clear to auscultation bilaterally.  Normal effort.  No respiratory distress.  No evidence of wheezes, rales, or rhonchi heard throughout. Abdominal:     General: Abdomen is flat. Bowel sounds are normal. There is no distension.     Tenderness: There is no abdominal tenderness. There is no guarding or rebound.  Musculoskeletal:        General: Normal range of motion.     Cervical back: Neck supple.  Skin:    General: Skin is warm and dry.     Findings: No rash.  Neurological:     General: No focal deficit present.     Mental Status: She is alert.  Psychiatric:        Mood and Affect: Mood normal.        Behavior: Behavior normal.     (all labs ordered are listed, but only abnormal results are displayed) Labs Reviewed  RESP PANEL BY RT-PCR (RSV, FLU A&B, COVID)  [id number]    EKG: None  Radiology: DG Chest 2 View Result Date: 09/09/2024 CLINICAL DATA:  Cough EXAM:  CHEST - 2 VIEW COMPARISON:  None available. FINDINGS: No focal airspace consolidation, pleural effusion, or pneumothorax. No cardiomegaly.No acute fracture or destructive lesion. IMPRESSION: No acute cardiopulmonary abnormality. Electronically Signed   By: Rogelia Myers M.D.   On: 09/09/2024 17:43     Procedures   Medications Ordered in the ED  oxymetazoline (AFRIN) 0.05 % nasal spray 1 spray (1 spray Each Nare Given 09/09/24 1742)    Clinical Course as of 09/09/24 1812  Wed Sep 09, 2024  1752 DG Chest 2 View No evidence of pneumonia. I do agree with the radiologist interpretation.  [CF]  1806 Resp panel by RT-PCR (RSV, Flu A&B, Covid) Anterior Nasal Swab Negative.  [CF]    Clinical Course User Index [CF] Theotis Cameron HERO, PA-C    Medical Decision Making [name] is a 33 y.o. female patient who  presents to the ED with a URI. This patient presents with symptoms suspicious for likely viral upper respiratory infection. Based on history and physical doubt sinusitis. COVID, flu, and RSV were negative. Do not suspect underlying cardiopulmonary process. I considered, but think unlikely, dangerous causes of this patient's symptoms to include ACS, CHF or COPD exacerbations, pneumonia, pneumothorax. Chest xray was normal. Patient is nontoxic appearing and not in need of emergent medical intervention.  Will plan to treat with over-the-counter Mucinex , tea, honey, ibuprofen . Afrin provided here.  Appropriate education around Afrin and rebound congestion were provided.  She expressed understanding.  Will also prescribe her Tessalon  Perles.  Will have her follow-up with her primary care doctor.  Strict turn precautions were discussed.  She is safe for discharge.  Amount and/or Complexity of Data Reviewed Labs:  Decision-making details documented in ED Course. Radiology: ordered. Decision-making details documented in ED Course.  Risk OTC drugs. Prescription drug management.     Final diagnoses:  Acute cough    ED Discharge Orders          Ordered    benzonatate  (TESSALON ) 100 MG capsule  Every 8 hours        09/09/24 1809               Theotis Cameron [place], [place] 09/09/24 1812    Geraldene Hamilton, MD 09/09/24 2258
